# Patient Record
Sex: Male | Born: 1965 | Race: White | Hispanic: No | Smoking: Current every day smoker
Health system: Southern US, Community
[De-identification: ages and names within clinical notes are randomized; demographics above are authoritative.]

## PROBLEM LIST (undated history)

## (undated) DIAGNOSIS — K5792 Diverticulitis of intestine, part unspecified, without perforation or abscess without bleeding: Secondary | ICD-10-CM

## (undated) DIAGNOSIS — I10 Essential (primary) hypertension: Secondary | ICD-10-CM

## (undated) DIAGNOSIS — E119 Type 2 diabetes mellitus without complications: Secondary | ICD-10-CM

## (undated) DIAGNOSIS — E7800 Pure hypercholesterolemia, unspecified: Secondary | ICD-10-CM

## (undated) HISTORY — DX: Type 2 diabetes mellitus without complications: E11.9

---

## 2011-02-09 ENCOUNTER — Encounter: Payer: Self-pay | Admitting: *Deleted

## 2011-02-09 ENCOUNTER — Emergency Department (HOSPITAL_COMMUNITY): Payer: BC Managed Care – PPO

## 2011-02-09 ENCOUNTER — Other Ambulatory Visit: Payer: Self-pay

## 2011-02-09 ENCOUNTER — Emergency Department (HOSPITAL_COMMUNITY)
Admission: EM | Admit: 2011-02-09 | Discharge: 2011-02-09 | Disposition: A | Discharge status: Home / Self Care | Payer: BC Managed Care – PPO | Attending: Emergency Medicine | Admitting: Emergency Medicine

## 2011-02-09 DIAGNOSIS — R112 Nausea with vomiting, unspecified: Secondary | ICD-10-CM | POA: Insufficient documentation

## 2011-02-09 DIAGNOSIS — Z79899 Other long term (current) drug therapy: Secondary | ICD-10-CM | POA: Insufficient documentation

## 2011-02-09 DIAGNOSIS — R0602 Shortness of breath: Secondary | ICD-10-CM | POA: Insufficient documentation

## 2011-02-09 DIAGNOSIS — E7800 Pure hypercholesterolemia, unspecified: Secondary | ICD-10-CM | POA: Insufficient documentation

## 2011-02-09 DIAGNOSIS — R0789 Other chest pain: Secondary | ICD-10-CM | POA: Insufficient documentation

## 2011-02-09 DIAGNOSIS — I10 Essential (primary) hypertension: Secondary | ICD-10-CM | POA: Insufficient documentation

## 2011-02-09 HISTORY — DX: Pure hypercholesterolemia, unspecified: E78.00

## 2011-02-09 HISTORY — DX: Essential (primary) hypertension: I10

## 2011-02-09 HISTORY — DX: Diverticulitis of intestine, part unspecified, without perforation or abscess without bleeding: K57.92

## 2011-02-09 LAB — POCT I-STAT TROPONIN I: Troponin i, poc: 0.01 ng/mL (ref 0.00–0.08)

## 2011-02-09 LAB — CARDIAC PANEL(CRET KIN+CKTOT+MB+TROPI)
CK, MB: 1.9 ng/mL (ref 0.3–4.0)
CK, MB: 2 ng/mL (ref 0.3–4.0)
Relative Index: INVALID (ref 0.0–2.5)
Troponin I: <0.3 ng/mL (ref <0.30)

## 2011-02-09 LAB — DIFFERENTIAL
Basophils Absolute: 0 10*3/uL (ref 0.0–0.1)
Basophils Relative: 0 % (ref 0–1)
Lymphocytes Relative: 29 % (ref 12–46)
Monocytes Relative: 9 % (ref 3–12)
Neutro Abs: 6.3 10*3/uL (ref 1.7–7.7)
Neutrophils Relative %: 58 % (ref 43–77)

## 2011-02-09 LAB — CBC
MCH: 31.3 pg (ref 26.0–34.0)
MCHC: 36.1 g/dL — ABNORMAL HIGH (ref 30.0–36.0)
MCV: 86.5 fL (ref 78.0–100.0)
Platelets: 262 10*3/uL (ref 150–400)
RDW: 12.2 % (ref 11.5–15.5)

## 2011-02-09 LAB — BASIC METABOLIC PANEL
CO2: 24 mEq/L (ref 19–32)
Calcium: 9.7 mg/dL (ref 8.4–10.5)
Creatinine, Ser: 0.67 mg/dL (ref 0.50–1.35)
GFR calc Af Amer: >90 mL/min (ref >90)
GFR calc non Af Amer: >90 mL/min (ref >90)
Sodium: 133 mEq/L — ABNORMAL LOW (ref 135–145)

## 2011-02-09 MED ORDER — NITROGLYCERIN 2 % TD OINT
1.0000 [in_us] | TOPICAL_OINTMENT | Freq: Once | TRANSDERMAL | Status: AC
  Administered 2011-02-09: 1 [in_us] via TOPICAL
  Filled 2011-02-09: qty 30

## 2011-02-09 MED ORDER — ASPIRIN 325 MG PO TABS: 325.0000 mg | ORAL_TABLET | ORAL | Status: DC

## 2011-02-09 MED ORDER — PANTOPRAZOLE SODIUM 20 MG PO TBEC: 20.0000 mg | DELAYED_RELEASE_TABLET | Freq: Every day | ORAL | Status: DC

## 2011-02-09 MED ORDER — ASPIRIN 81 MG PO CHEW
324.0000 mg | CHEWABLE_TABLET | Freq: Once | ORAL | Status: AC
  Administered 2011-02-09: 324 mg via ORAL
  Filled 2011-02-09: qty 4

## 2011-02-09 NOTE — ED Notes (Signed)
Pt in bed, alert and oriented x 3, resp even and unlabored, mid sternal chest pain subsided to scale 5/10, pt states " i felt better, aspirin helped" will continue to monitor

## 2011-02-09 NOTE — ED Notes (Signed)
Pt placed on cardiac monitor and nasal cannula.

## 2011-02-09 NOTE — ED Notes (Signed)
Sleeping, appears comfortable, pending disposition

## 2011-02-09 NOTE — ED Provider Notes (Signed)
Medical screening examination/treatment/procedure(s) were performed by non-physician practitioner and as supervising physician I was immediately available for consultation/collaboration.  Ethelda Chick, MD 02/09/11 [phone]

## 2011-02-09 NOTE — ED Notes (Signed)
Please do not charge pt for foley cath, incorrectly charted

## 2011-02-09 NOTE — ED Provider Notes (Signed)
History     CSN: [id number]  Arrival date & time 02/09/11  0047   First MD Initiated Contact with Patient 02/09/11 0330      Chief Complaint  Patient presents with  . Chest Pain    belching  . Nausea  . Emesis    x 1 at 2100 last night  . Shortness of Breath    dyspnea @ rest    (Consider location/radiation/quality/duration/timing/severity/associated sxs/prior treatment) HPI Comments: Patient here with a 8 hour history of substernal chest pain - he states that he initially thought that he had gotten something stuck in his esophagus, reports that he felt like if he would belch that he would feel better - reports nausea and vomiting x 1 which did not help with the pain - denies diaphoresis - reports that the pain is more pressure in nature and states that he feels worse with trying to lie flat, states that he feels like he cannot breath when doing so - also reports increase in pain with deep inspiration - denies fever, chills, cough or congestion - states no personal history of CAD, recently diagnosed with hypercholesterolism but is not on any medication, takes medication for high triglycerides.  Denies diabetes   Patient is a 45 y.o. male presenting with chest pain, vomiting, and shortness of breath. The history is provided by the patient and the spouse. No language interpreter was used.  Chest Pain The chest pain began 6 - 12 hours ago. Chest pain occurs constantly. The chest pain is improving. The pain is associated with breathing (lying flat). At its most intense, the pain is at 10/10. The pain is currently at 3/10. The severity of the pain is mild. The quality of the pain is described as pressure-like. The pain does not radiate. Chest pain is worsened by deep breathing and certain positions. Primary symptoms include shortness of breath, nausea and vomiting. Pertinent negatives for primary symptoms include no fever, no fatigue, no syncope, no cough, no wheezing, no abdominal pain, no  dizziness and no altered mental status.  Pertinent negatives for associated symptoms include no claudication, no diaphoresis, no lower extremity edema, no near-syncope, no numbness, no orthopnea, no paroxysmal nocturnal dyspnea and no weakness. He tried aspirin for the symptoms. Risk factors include male gender and smoking/tobacco exposure.    Emesis  Pertinent negatives include no abdominal pain, no cough and no fever.  Shortness of Breath  Associated symptoms include chest pain and shortness of breath. Pertinent negatives include no orthopnea, no fever, no cough and no wheezing.    Past Medical History  Diagnosis Date  . Hypertension   . Hypercholesteremia   . Diverticulitis     History reviewed. No pertinent past surgical history.  History reviewed. No pertinent family history.  History  Substance Use Topics  . Smoking status: Current Everyday Smoker -- 1.0 packs/day    Types: Cigarettes  . Smokeless tobacco: Not on file  . Alcohol Use: Yes     every other day, 2-3 beers at a time      Review of Systems  Constitutional: Negative for fever, diaphoresis and fatigue.  Respiratory: Positive for shortness of breath. Negative for cough and wheezing.   Cardiovascular: Positive for chest pain. Negative for orthopnea, claudication, syncope and near-syncope.  Gastrointestinal: Positive for nausea and vomiting. Negative for abdominal pain.  Neurological: Negative for dizziness, weakness and numbness.  Psychiatric/Behavioral: Negative for altered mental status.  All other systems reviewed and are negative.    Allergies  Penicillins  Home Medications   Current Outpatient Rx  Name Route Sig Dispense Refill  . AMLODIPINE BESYLATE 10 MG PO TABS Oral Take 10 mg by mouth daily.      Marland Kitchen CIPROFLOXACIN HCL 500 MG PO TABS Oral Take 500 mg by mouth 2 (two) times daily.      Marland Kitchen LISINOPRIL-HYDROCHLOROTHIAZIDE 20-25 MG PO TABS Oral Take 1 tablet by mouth daily.      Marland Kitchen METRONIDAZOLE 500 MG  PO TABS Oral Take 500 mg by mouth 3 (three) times daily.      Marland Kitchen RANITIDINE HCL 150 MG PO CAPS Oral Take 150 mg by mouth as needed.      Marland Kitchen ROSUVASTATIN CALCIUM 5 MG PO TABS Oral Take 5 mg by mouth daily.      . IBUPROFEN 200 MG PO TABS Oral Take 200 mg by mouth every 6 (six) hours as needed.        BP 140/80  Pulse 77  Temp(Src) 98 F (36.7 C) (Oral)  Resp 13  SpO2 98%  Physical Exam  Nursing note and vitals reviewed. Constitutional: He is oriented to person, place, and time. He appears well-developed and well-nourished. No distress.  HENT:  Head: Normocephalic and atraumatic.  Right Ear: External ear normal.  Left Ear: External ear normal.  Mouth/Throat: Oropharynx is clear and moist. No oropharyngeal exudate.  Eyes: Conjunctivae are normal. Pupils are equal, round, and reactive to light. No scleral icterus.  Neck: Normal range of motion. Neck supple. No JVD present.  Cardiovascular: Normal rate, regular rhythm and normal heart sounds.  Exam reveals no gallop and no friction rub.   No murmur heard. Pulmonary/Chest: Effort normal and breath sounds normal. No respiratory distress. He exhibits no tenderness.  Abdominal: Soft. Bowel sounds are normal. He exhibits no distension. There is no tenderness.  Musculoskeletal: Normal range of motion.  Neurological: He is alert and oriented to person, place, and time. No cranial nerve deficit.  Skin: Skin is warm and dry.  Psychiatric: He has a normal mood and affect. His behavior is normal. Judgment and thought content normal.    ED Course  Procedures (including critical care time)  Labs Reviewed  CBC - Abnormal; Notable for the following:    WBC 10.6 (*)    MCHC 36.1 (*)    All other components within normal limits  BASIC METABOLIC PANEL - Abnormal; Notable for the following:    Sodium 133 (*)    Glucose, Bld 146 (*)    All other components within normal limits  POCT I-STAT TROPONIN I  I-STAT TROPONIN I  D-DIMER, QUANTITATIVE    CARDIAC PANEL(CRET KIN+CKTOT+MB+TROPI)  DIFFERENTIAL   Dg Chest 2 View  02/09/2011  *RADIOLOGY REPORT*  Clinical Data: Sudden onset of chest pain and shortness of breath.  CHEST - 2 VIEW  Comparison: None.  Findings: The lungs are well-aerated and clear.  There is no evidence of focal opacification, pleural effusion or pneumothorax.  The heart is normal in size; the mediastinal contour is within normal limits.  No acute osseous abnormalities are seen.  IMPRESSION: No acute cardiopulmonary process seen.  Original Report Authenticated By: Tonia Ghent, M.D.   Results for orders placed during the hospital encounter of 02/09/11  CBC      Component Value Range   WBC 10.6 (*) 4.0 - 10.5 (K/uL)   RBC 4.80  4.22 - 5.81 (MIL/uL)   Hemoglobin 15.0  13.0 - 17.0 (g/dL)   HCT 13.2  44.0 - 10.2 (%)  MCV 86.5  78.0 - 100.0 (fL)   MCH 31.3  26.0 - 34.0 (pg)   MCHC 36.1 (*) 30.0 - 36.0 (g/dL)   RDW 14.7  82.9 - 56.2 (%)   Platelets 262  150 - 400 (K/uL)  BASIC METABOLIC PANEL      Component Value Range   Sodium 133 (*) 135 - 145 (mEq/L)   Potassium 3.7  3.5 - 5.1 (mEq/L)   Chloride 97  96 - 112 (mEq/L)   CO2 24  19 - 32 (mEq/L)   Glucose, Bld 146 (*) 70 - 99 (mg/dL)   BUN 12  6 - 23 (mg/dL)   Creatinine, Ser 1.30  0.50 - 1.35 (mg/dL)   Calcium 9.7  8.4 - 86.5 (mg/dL)   GFR calc non Af Amer >90  >90 (mL/min)   GFR calc Af Amer >90  >90 (mL/min)  POCT I-STAT TROPONIN I      Component Value Range   Troponin i, poc 0.01  0.00 - 0.08 (ng/mL)   Comment 3           D-DIMER, QUANTITATIVE      Component Value Range   D-Dimer, Quant <0.22  0.00 - 0.48 (ug/mL-FEU)  CARDIAC PANEL(CRET KIN+CKTOT+MB+TROPI)      Component Value Range   Total CK 65  7 - 232 (U/L)   CK, MB 1.9  0.3 - 4.0 (ng/mL)   Troponin I <0.30  <0.30 (ng/mL)   Relative Index RELATIVE INDEX IS INVALID  0.0 - 2.5   DIFFERENTIAL      Component Value Range   Neutrophils Relative 58  43 - 77 (%)   Neutro Abs 6.3  1.7 - 7.7 (K/uL)    Lymphocytes Relative 29  12 - 46 (%)   Lymphs Abs 3.1  0.7 - 4.0 (K/uL)   Monocytes Relative 9  3 - 12 (%)   Monocytes Absolute 0.9  0.1 - 1.0 (K/uL)   Eosinophils Relative 4  0 - 5 (%)   Eosinophils Absolute 0.4  0.0 - 0.7 (K/uL)   Basophils Relative 0  0 - 1 (%)   Basophils Absolute 0.0  0.0 - 0.1 (K/uL)  CARDIAC PANEL(CRET KIN+CKTOT+MB+TROPI)      Component Value Range   Total CK 64  7 - 232 (U/L)   CK, MB 2.0  0.3 - 4.0 (ng/mL)   Troponin I <0.30  <0.30 (ng/mL)   Relative Index RELATIVE INDEX IS INVALID  0.0 - 2.5    Dg Chest 2 View  02/09/2011  *RADIOLOGY REPORT*  Clinical Data: Sudden onset of chest pain and shortness of breath.  CHEST - 2 VIEW  Comparison: None.  Findings: The lungs are well-aerated and clear.  There is no evidence of focal opacification, pleural effusion or pneumothorax.  The heart is normal in size; the mediastinal contour is within normal limits.  No acute osseous abnormalities are seen.  IMPRESSION: No acute cardiopulmonary process seen.  Original Report Authenticated By: Tonia Ghent, M.D.    Date: 02/09/2011  Rate: 89  Rhythm: normal sinus rhythm  QRS Axis: normal  Intervals: normal  ST/T Wave abnormalities: normal  Conduction Disutrbances:none  Narrative Interpretation: Reviewed by Dr. Karma Ganja  Old EKG Reviewed: none available    Atypical Chest pain    MDM  Patient with atypical chest pain, HEART score of 3, low risk, two sets of enzymes here are negative with no delta change - patient will follow up with PCP this coming week - knows to return  if any worsening of symptoms.  He thinks this is GERD, so I will place on protonix        Zenas Santa C. [place], [place] 02/09/11 [phone]

## 2011-02-09 NOTE — ED Notes (Signed)
Pt c/o chest pain, substernal, non-reproducible w/ palpation. Pt states feels urge to belch. Pt has vomited x 1. Pt reports pain radiating to R shoulder and R neck.

## 2012-12-03 IMAGING — CR DG CHEST 2V
2 series · 2 of 2 positions shown · non-contrast
Comparison: None.

CLINICAL DATA: Sudden onset of chest pain and shortness of breath.

CHEST - 2 VIEW

[w chest pa]
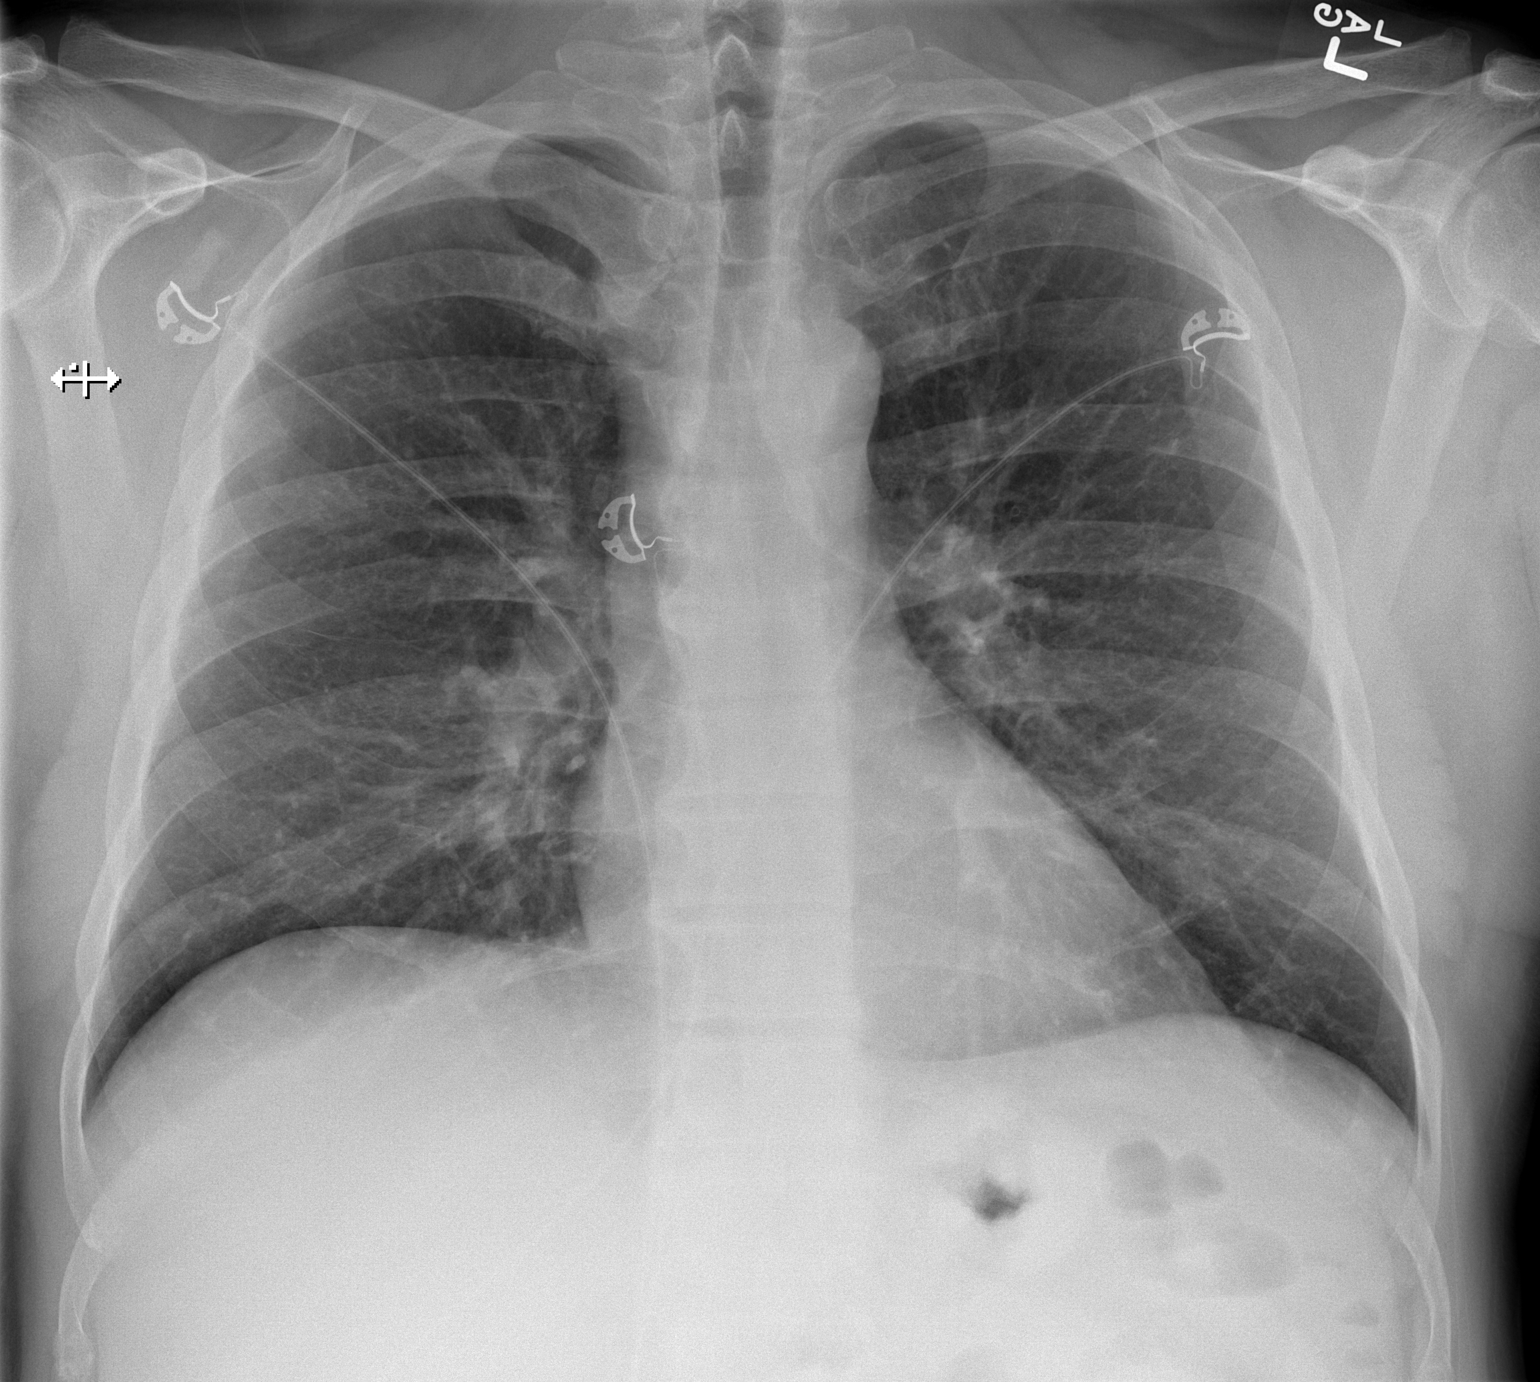

[w chest lat]
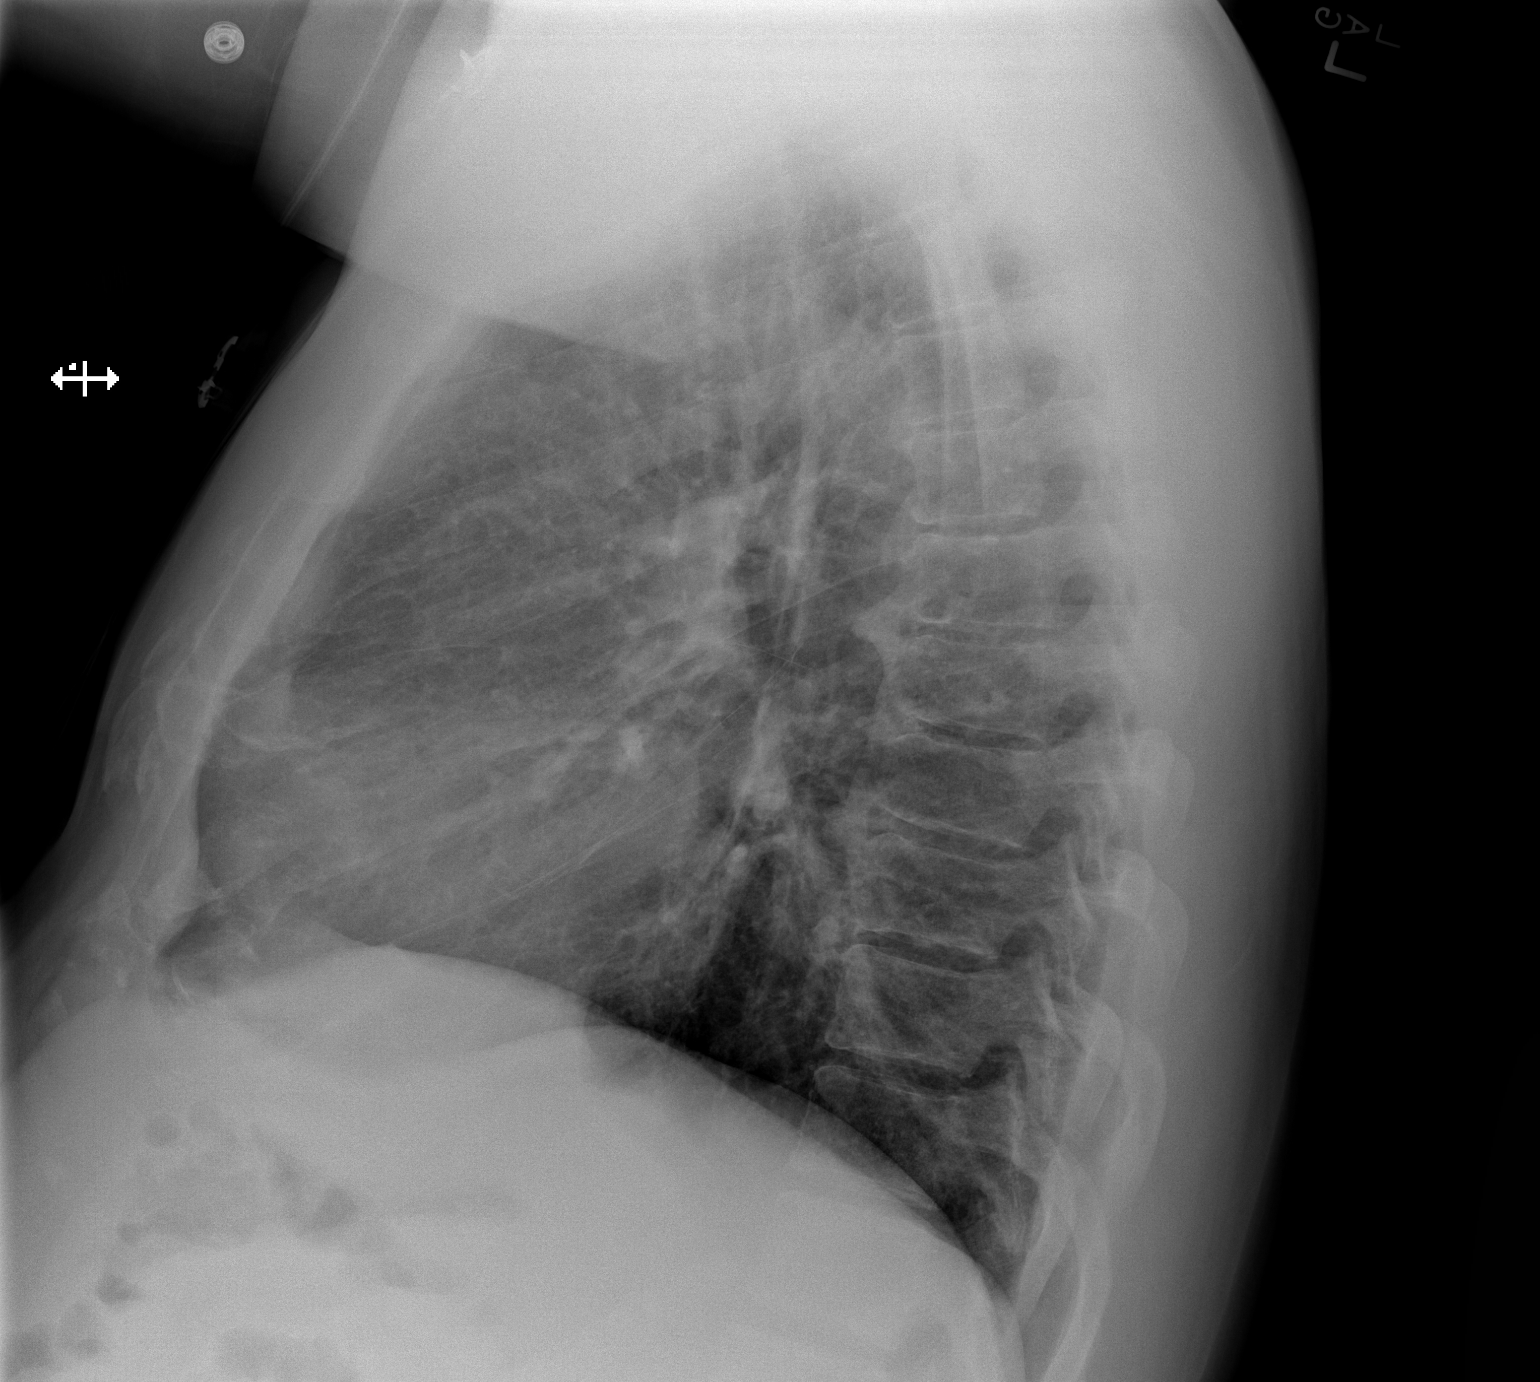

[2 of 2 positions shown; findings below may reference images not displayed]

FINDINGS: The lungs are well-aerated and clear.  There is no
evidence of focal opacification, pleural effusion or pneumothorax.

The heart is normal in size; the mediastinal contour is within
normal limits.  No acute osseous abnormalities are seen.
IMPRESSION: No acute cardiopulmonary process seen.

## 2015-10-29 DIAGNOSIS — Z86010 Personal history of colonic polyps: Secondary | ICD-10-CM | POA: Insufficient documentation

## 2016-05-08 LAB — HM COLONOSCOPY

## 2020-05-29 ENCOUNTER — Other Ambulatory Visit: Payer: Self-pay

## 2020-05-29 ENCOUNTER — Ambulatory Visit: Payer: BC Managed Care – PPO | Admitting: Family Medicine

## 2020-05-29 ENCOUNTER — Encounter: Payer: Self-pay | Admitting: Family Medicine

## 2020-05-29 VITALS — BP 115/76 | HR 81 | Temp 98.3°F | Ht 72.0 in | Wt 219.0 lb

## 2020-05-29 DIAGNOSIS — I10 Essential (primary) hypertension: Secondary | ICD-10-CM

## 2020-05-29 DIAGNOSIS — E1165 Type 2 diabetes mellitus with hyperglycemia: Secondary | ICD-10-CM | POA: Diagnosis not present

## 2020-05-29 DIAGNOSIS — Z8719 Personal history of other diseases of the digestive system: Secondary | ICD-10-CM

## 2020-05-29 DIAGNOSIS — G479 Sleep disorder, unspecified: Secondary | ICD-10-CM

## 2020-05-29 DIAGNOSIS — R208 Other disturbances of skin sensation: Secondary | ICD-10-CM

## 2020-05-29 DIAGNOSIS — K219 Gastro-esophageal reflux disease without esophagitis: Secondary | ICD-10-CM

## 2020-05-29 DIAGNOSIS — E782 Mixed hyperlipidemia: Secondary | ICD-10-CM | POA: Diagnosis not present

## 2020-05-29 MED ORDER — OMEPRAZOLE 20 MG PO CPDR: 20.0000 mg | DELAYED_RELEASE_CAPSULE | Freq: Every day | ORAL | 1 refills | Status: DC

## 2020-05-29 MED ORDER — LISINOPRIL 20 MG PO TABS: 1.0000 | ORAL_TABLET | Freq: Every day | ORAL | 1 refills | Status: DC

## 2020-05-29 MED ORDER — METFORMIN HCL 1000 MG PO TABS: 1.0000 | ORAL_TABLET | Freq: Two times a day (BID) | ORAL | 1 refills | Status: DC

## 2020-05-29 MED ORDER — HYDROCHLOROTHIAZIDE 50 MG PO TABS: 50.0000 mg | ORAL_TABLET | Freq: Every day | ORAL | 1 refills | Status: DC

## 2020-05-29 NOTE — Progress Notes (Signed)
New Patient Office Visit  Assessment & Plan:  1. Essential hypertension Well controlled on current regimen.  - hydrochlorothiazide (HYDRODIURIL) 50 MG tablet; Take 1 tablet (50 mg total) by mouth daily.  Dispense: 90 tablet; Refill: 1 - lisinopril (ZESTRIL) 20 MG tablet; Take 1 tablet (20 mg total) by mouth daily.  Dispense: 90 tablet; Refill: 1 - CMP14+EGFR; Future - Lipid panel; Future  2. Mixed hyperlipidemia Labs to assess. - Omega-3 Fatty Acids (FISH OIL) 1000 MG CAPS; Take 1,000 mg by mouth daily. - Lipid panel; Future  3. Type 2 diabetes mellitus with hyperglycemia, without long-term current use of insulin (HCC) A1c ordered to assess.  Last A1c 8.2 six months ago at which time patient reports no medication changes were made. - Medications: continue current medications until labs result - Patient is not currently taking a statin. Patient is taking an ACE-inhibitor/ARB.  - Instruction/counseling given: reminded to get eye exam, discussed diet and provided printed educational material  Diabetes Health Maintenance Due  Topic Date Due  . HEMOGLOBIN A1C  Never done  . FOOT EXAM  Never done  . OPHTHALMOLOGY EXAM  Never done    - metFORMIN (GLUCOPHAGE) 1000 MG tablet; Take 1 tablet (1,000 mg total) by mouth 2 (two) times daily.  Dispense: 180 tablet; Refill: 1 - CMP14+EGFR; Future - Lipid panel; Future - Bayer DCA Hb A1c Waived; Future  4. Gastroesophageal reflux disease, unspecified whether esophagitis present Well controlled on current regimen.  - omeprazole (PRILOSEC) 20 MG capsule; Take 1 capsule (20 mg total) by mouth daily.  Dispense: 90 capsule; Refill: 1 - CMP14+EGFR; Future  5. Burning sensation of feet Labs to assess for a cause. - Anemia Profile B; Future - CMP14+EGFR; Future - Thyroid Panel With TSH; Future - Magnesium; Future  6. History of diverticulitis Managed by gastroenterology.  7. Difficulty sleeping Discussed with patient I do not prescribe  Xanax to help with sleep.  I offered multiple other medications to help with sleep but he declined at this time.  No need to wean off the Xanax as he does not take it regularly.   Follow-up: Return in about 3 months (around 08/28/2020) for follow-up of chronic medication conditions.   Deliah Boston, MSN, APRN, FNP-C [facility]  Subjective:  Patient ID: Ricardo Duhe., male    DOB: Mar 19, 1965  Age: 55 y.o. MRN: [id number]  Patient Care Team: Gwenlyn Fudge, FNP as PCP - General (Family Medicine)  CC:  Chief Complaint  Patient presents with  . New Patient (Initial Visit)    [facility]   . Establish Care    Patient states that he has been having bilateral feet burning for months.     HPI Ricardo Luna. presents to establish care.   Hypertension: Controlled with hydrochlorothiazide and lisinopril.  Hyperlipidemia: Patient is taking fish oil 1000 mg once daily.  Diabetes: Patient presents for follow up of diabetes. Current symptoms include: hyperglycemia. Known diabetic complications: none. Medication compliance: Taking metformin 1000 mg twice daily for over a year now. Is he  on ACE inhibitor or angiotensin II receptor blocker? Yes. Is he on a statin? No.   GERD: Controlled with omeprazole.  Patient reports a burning sensation that occurs on the medial aspect of his right great toe and the lateral aspect of his left foot and pinky toe.  This occurs late in the evenings and has been going on for a few months now.  He has a foam that  treats cramps at home that is helpful when he applies it.  History of diverticulitis: Patient has as needed prescriptions for Cipro and Flagyl that come from his gastroenterologist.  Patient has a prescription for Xanax that he takes when he has trouble sleeping.  It is a 1 mg tablet but he does not take the full tablet.  His last prescription for 15 tablets was filled on 01/04/2020 and he reports he still has some  left.   Review of Systems  Constitutional: Negative for chills, fever, malaise/fatigue and weight loss.  HENT: Negative for congestion, ear discharge, ear pain, nosebleeds, sinus pain, sore throat and tinnitus.   Eyes: Negative for blurred vision, double vision, pain, discharge and redness.  Respiratory: Negative for cough, shortness of breath and wheezing.   Cardiovascular: Negative for chest pain, palpitations and leg swelling.  Gastrointestinal: Negative for abdominal pain, constipation, diarrhea, heartburn, nausea and vomiting.  Genitourinary: Negative for dysuria, frequency and urgency.  Musculoskeletal: Negative for myalgias.  Skin: Negative for rash.  Neurological: Negative for dizziness, seizures, weakness and headaches.  Psychiatric/Behavioral: Negative for depression, substance abuse and suicidal ideas. The patient is not nervous/anxious.     Current Outpatient Medications:  .  ALPRAZolam (XANAX) 1 MG tablet, Take 1 mg by mouth at bedtime as needed for anxiety., Disp: , Rfl:  .  aspirin EC 81 MG tablet, Take 81 mg by mouth daily. Swallow whole., Disp: , Rfl:  .  ciprofloxacin (CIPRO) 500 MG tablet, Take 500 mg by mouth 2 (two) times daily., Disp: , Rfl:  .  hydrochlorothiazide (HYDRODIURIL) 50 MG tablet, Take 50 mg by mouth daily., Disp: , Rfl:  .  lisinopril (ZESTRIL) 20 MG tablet, Take 1 tablet by mouth daily., Disp: , Rfl:  .  metFORMIN (GLUCOPHAGE) 1000 MG tablet, Take 1 tablet by mouth 2 (two) times daily., Disp: , Rfl:  .  metroNIDAZOLE (FLAGYL) 500 MG tablet, Take 500 mg by mouth 3 (three) times daily as needed., Disp: , Rfl:  .  Omega-3 Fatty Acids (FISH OIL) 1000 MG CAPS, Take by mouth., Disp: , Rfl:  .  omeprazole (PRILOSEC) 20 MG capsule, Take 20 mg by mouth daily., Disp: , Rfl:   Allergies  Allergen Reactions  . Penicillins     Past Medical History:  Diagnosis Date  . Diverticulitis   . Hypercholesteremia   . Hypertension     History reviewed. No  pertinent surgical history.  Family History  Problem Relation Age of Onset  . Hypertension Mother   . Diabetes Mother   . Stomach cancer Mother   . Diabetes Father   . Thyroid cancer Daughter     Social History   Socioeconomic History  . Marital status: Single    Spouse name: Not on file  . Number of children: Not on file  . Years of education: Not on file  . Highest education level: Not on file  Occupational History  . Not on file  Tobacco Use  . Smoking status: Current Every Day Smoker    Packs/day: 1.00    Types: Cigarettes  . Smokeless tobacco: Never Used  Vaping Use  . Vaping Use: Never used  Substance and Sexual Activity  . Alcohol use: Yes    Comment: every other day, 2-3 beers at a time  . Drug use: No  . Sexual activity: Yes    Birth control/protection: None  Other Topics Concern  . Not on file  Social History Narrative  . Not on file  Social Determinants of Health   Financial Resource Strain: Not on file  Food Insecurity: Not on file  Transportation Needs: Not on file  Physical Activity: Not on file  Stress: Not on file  Social Connections: Not on file  Intimate Partner Violence: Not on file    Objective:   Today's Vitals: BP 115/76   Pulse 81   Temp 98.3 F (36.8 C) (Temporal)   Ht 6' (1.829 m)   Wt 219 lb (99.3 kg)   SpO2 95%   BMI 29.70 kg/m   Physical Exam Vitals reviewed.  Constitutional:      General: He is not in acute distress.    Appearance: Normal appearance. He is overweight. He is not ill-appearing, toxic-appearing or diaphoretic.  HENT:     Head: Normocephalic and atraumatic.  Eyes:     General: No scleral icterus.       Right eye: No discharge.        Left eye: No discharge.     Conjunctiva/sclera: Conjunctivae normal.  Cardiovascular:     Rate and Rhythm: Normal rate and regular rhythm.     Heart sounds: Normal heart sounds. No murmur heard. No friction rub. No gallop.   Pulmonary:     Effort: Pulmonary effort is  normal. No respiratory distress.     Breath sounds: Normal breath sounds. No stridor. No wheezing, rhonchi or rales.  Musculoskeletal:        General: Normal range of motion.     Cervical back: Normal range of motion.  Skin:    General: Skin is warm and dry.  Neurological:     Mental Status: He is alert and oriented to person, place, and time. Mental status is at baseline.  Psychiatric:        Mood and Affect: Mood normal.        Behavior: Behavior normal.        Thought Content: Thought content normal.        Judgment: Judgment normal.

## 2020-05-29 NOTE — Patient Instructions (Signed)
Diabetes Mellitus and Nutrition, Adult When you have diabetes, or diabetes mellitus, it is very important to have healthy eating habits because your blood sugar (glucose) levels are greatly affected by what you eat and drink. Eating healthy foods in the right amounts, at about the same times every day, can help you:  Control your blood glucose.  Lower your risk of heart disease.  Improve your blood pressure.  Reach or maintain a healthy weight. What can affect my meal plan? Every person with diabetes is different, and each person has different needs for a meal plan. Your health care provider may recommend that you work with a dietitian to make a meal plan that is best for you. Your meal plan may vary depending on factors such as:  The calories you need.  The medicines you take.  Your weight.  Your blood glucose, blood pressure, and cholesterol levels.  Your activity level.  Other health conditions you have, such as heart or kidney disease. How do carbohydrates affect me? Carbohydrates, also called carbs, affect your blood glucose level more than any other type of food. Eating carbs naturally raises the amount of glucose in your blood. Carb counting is a method for keeping track of how many carbs you eat. Counting carbs is important to keep your blood glucose at a healthy level, especially if you use insulin or take certain oral diabetes medicines. It is important to know how many carbs you can safely have in each meal. This is different for every person. Your dietitian can help you calculate how many carbs you should have at each meal and for each snack. How does alcohol affect me? Alcohol can cause a sudden decrease in blood glucose (hypoglycemia), especially if you use insulin or take certain oral diabetes medicines. Hypoglycemia can be a life-threatening condition. Symptoms of hypoglycemia, such as sleepiness, dizziness, and confusion, are similar to symptoms of having too much  alcohol.  Do not drink alcohol if: ? Your health care provider tells you not to drink. ? You are pregnant, may be pregnant, or are planning to become pregnant.  If you drink alcohol: ? Do not drink on an empty stomach. ? Limit how much you use to:  0-1 drink a day for women.  0-2 drinks a day for men. ? Be aware of how much alcohol is in your drink. In the [place] one drink equals one 12 oz bottle of beer (355 mL), one 5 oz glass of wine (148 mL), or one 1 oz glass of hard liquor (44 mL). ? Keep yourself hydrated with water, diet soda, or unsweetened iced tea.  Keep in mind that regular soda, juice, and other mixers may contain a lot of sugar and must be counted as carbs. What are tips for following this plan? Reading food labels  Start by checking the serving size on the "Nutrition Facts" label of packaged foods and drinks. The amount of calories, carbs, fats, and other nutrients listed on the label is based on one serving of the item. Many items contain more than one serving per package.  Check the total grams (g) of carbs in one serving. You can calculate the number of servings of carbs in one serving by dividing the total carbs by 15. For example, if a food has 30 g of total carbs per serving, it would be equal to 2 servings of carbs.  Check the number of grams (g) of saturated fats and trans fats in one serving. Choose foods that have   a low amount or none of these fats.  Check the number of milligrams (mg) of salt (sodium) in one serving. Most people should limit total sodium intake to less than 2,300 mg per day.  Always check the nutrition information of foods labeled as "low-fat" or "nonfat." These foods may be higher in added sugar or refined carbs and should be avoided.  Talk to your dietitian to identify your daily goals for nutrients listed on the label. Shopping  Avoid buying canned, pre-made, or processed foods. These foods tend to be high in fat, sodium, and added  sugar.  Shop around the outside edge of the grocery store. This is where you will most often find fresh fruits and vegetables, bulk grains, fresh meats, and fresh dairy. Cooking  Use low-heat cooking methods, such as baking, instead of high-heat cooking methods like deep frying.  Cook using healthy oils, such as olive, canola, or sunflower oil.  Avoid cooking with butter, cream, or high-fat meats. Meal planning  Eat meals and snacks regularly, preferably at the same times every day. Avoid going long periods of time without eating.  Eat foods that are high in fiber, such as fresh fruits, vegetables, beans, and whole grains. Talk with your dietitian about how many servings of carbs you can eat at each meal.  Eat 4-6 oz (112-168 g) of lean protein each day, such as lean meat, chicken, fish, eggs, or tofu. One ounce (oz) of lean protein is equal to: ? 1 oz (28 g) of meat, chicken, or fish. ? 1 egg. ?  cup (62 g) of tofu.  Eat some foods each day that contain healthy fats, such as avocado, nuts, seeds, and fish.   What foods should I eat? Fruits Berries. Apples. Oranges. Peaches. Apricots. Plums. Grapes. Mango. Papaya. Pomegranate. Kiwi. Cherries. Vegetables Lettuce. Spinach. Leafy greens, including kale, chard, collard greens, and mustard greens. Beets. Cauliflower. Cabbage. Broccoli. Carrots. Green beans. Tomatoes. Peppers. Onions. Cucumbers. Brussels sprouts. Grains Whole grains, such as whole-wheat or whole-grain bread, crackers, tortillas, cereal, and pasta. Unsweetened oatmeal. Quinoa. Brown or wild rice. Meats and other proteins Seafood. Poultry without skin. Lean cuts of poultry and beef. Tofu. Nuts. Seeds. Dairy Low-fat or fat-free dairy products such as milk, yogurt, and cheese. The items listed above may not be a complete list of foods and beverages you can eat. Contact a dietitian for more information. What foods should I avoid? Fruits Fruits canned with  syrup. Vegetables Canned vegetables. Frozen vegetables with butter or cream sauce. Grains Refined white flour and flour products such as bread, pasta, snack foods, and cereals. Avoid all processed foods. Meats and other proteins Fatty cuts of meat. Poultry with skin. Breaded or fried meats. Processed meat. Avoid saturated fats. Dairy Full-fat yogurt, cheese, or milk. Beverages Sweetened drinks, such as soda or iced tea. The items listed above may not be a complete list of foods and beverages you should avoid. Contact a dietitian for more information. Questions to ask a health care provider  Do I need to meet with a diabetes educator?  Do I need to meet with a dietitian?  What number can I call if I have questions?  When are the best times to check my blood glucose? Where to find more information:  American Diabetes Association: diabetes.org  Academy of Nutrition and Dietetics: www.eatright.org  National Institute of Diabetes and Digestive and Kidney Diseases: www.niddk.nih.gov  Association of Diabetes Care and Education Specialists: www.diabeteseducator.org Summary  It is important to have healthy eating   habits because your blood sugar (glucose) levels are greatly affected by what you eat and drink.  A healthy meal plan will help you control your blood glucose and maintain a healthy lifestyle.  Your health care provider may recommend that you work with a dietitian to make a meal plan that is best for you.  Keep in mind that carbohydrates (carbs) and alcohol have immediate effects on your blood glucose levels. It is important to count carbs and to use alcohol carefully. This information is not intended to replace advice given to you by your health care provider. Make sure you discuss any questions you have with your health care provider. Document Revised: 01/10/2019 Document Reviewed: 01/10/2019 Elsevier Patient Education  2021 Elsevier Inc.  

## 2020-06-03 ENCOUNTER — Encounter: Payer: Self-pay | Admitting: Family Medicine

## 2020-06-03 DIAGNOSIS — R208 Other disturbances of skin sensation: Secondary | ICD-10-CM | POA: Insufficient documentation

## 2020-06-03 DIAGNOSIS — I10 Essential (primary) hypertension: Secondary | ICD-10-CM | POA: Insufficient documentation

## 2020-06-03 DIAGNOSIS — Z8719 Personal history of other diseases of the digestive system: Secondary | ICD-10-CM | POA: Insufficient documentation

## 2020-06-03 DIAGNOSIS — G479 Sleep disorder, unspecified: Secondary | ICD-10-CM | POA: Insufficient documentation

## 2020-06-03 DIAGNOSIS — E1121 Type 2 diabetes mellitus with diabetic nephropathy: Secondary | ICD-10-CM | POA: Insufficient documentation

## 2020-06-03 DIAGNOSIS — E782 Mixed hyperlipidemia: Secondary | ICD-10-CM | POA: Insufficient documentation

## 2020-06-03 DIAGNOSIS — K219 Gastro-esophageal reflux disease without esophagitis: Secondary | ICD-10-CM | POA: Insufficient documentation

## 2020-06-03 DIAGNOSIS — E1165 Type 2 diabetes mellitus with hyperglycemia: Secondary | ICD-10-CM | POA: Insufficient documentation

## 2020-06-28 ENCOUNTER — Other Ambulatory Visit: Payer: Self-pay

## 2020-06-28 ENCOUNTER — Other Ambulatory Visit: Payer: BC Managed Care – PPO

## 2020-06-28 DIAGNOSIS — I10 Essential (primary) hypertension: Secondary | ICD-10-CM

## 2020-06-28 DIAGNOSIS — E1165 Type 2 diabetes mellitus with hyperglycemia: Secondary | ICD-10-CM

## 2020-06-28 DIAGNOSIS — K219 Gastro-esophageal reflux disease without esophagitis: Secondary | ICD-10-CM

## 2020-06-28 DIAGNOSIS — R208 Other disturbances of skin sensation: Secondary | ICD-10-CM

## 2020-06-28 DIAGNOSIS — E782 Mixed hyperlipidemia: Secondary | ICD-10-CM

## 2020-06-28 LAB — BAYER DCA HB A1C WAIVED: HB A1C (BAYER DCA - WAIVED): 8.4 % — ABNORMAL HIGH (ref <7.0)

## 2020-06-29 LAB — LIPID PANEL
Chol/HDL Ratio: 6.4 ratio — ABNORMAL HIGH (ref 0.0–5.0)
Cholesterol, Total: 172 mg/dL (ref 100–199)
HDL: 27 mg/dL — ABNORMAL LOW (ref >39)
LDL Chol Calc (NIH): 75 mg/dL (ref 0–99)
Triglycerides: 441 mg/dL — ABNORMAL HIGH (ref 0–149)
VLDL Cholesterol Cal: 70 mg/dL — ABNORMAL HIGH (ref 5–40)

## 2020-06-29 LAB — THYROID PANEL WITH TSH
Free Thyroxine Index: 2.1 (ref 1.2–4.9)
T3 Uptake Ratio: 31 % (ref 24–39)
T4, Total: 6.8 ug/dL (ref 4.5–12.0)
TSH: 2.04 u[IU]/mL (ref 0.450–4.500)

## 2020-06-29 LAB — CMP14+EGFR
ALT: 32 IU/L (ref 0–44)
AST: 20 IU/L (ref 0–40)
Albumin/Globulin Ratio: 2.1 (ref 1.2–2.2)
Albumin: 4.7 g/dL (ref 3.8–4.9)
Alkaline Phosphatase: 53 IU/L (ref 44–121)
BUN/Creatinine Ratio: 14 (ref 9–20)
BUN: 9 mg/dL (ref 6–24)
Bilirubin Total: 1 mg/dL (ref 0.0–1.2)
CO2: 22 mmol/L (ref 20–29)
Calcium: 9.4 mg/dL (ref 8.7–10.2)
Chloride: 93 mmol/L — ABNORMAL LOW (ref 96–106)
Creatinine, Ser: 0.64 mg/dL — ABNORMAL LOW (ref 0.76–1.27)
Globulin, Total: 2.2 g/dL (ref 1.5–4.5)
Glucose: 218 mg/dL — ABNORMAL HIGH (ref 65–99)
Potassium: 4 mmol/L (ref 3.5–5.2)
Sodium: 134 mmol/L (ref 134–144)
Total Protein: 6.9 g/dL (ref 6.0–8.5)
eGFR: 111 mL/min/{1.73_m2} (ref >59)

## 2020-06-29 LAB — ANEMIA PROFILE B
Basophils Absolute: 0.1 10*3/uL (ref 0.0–0.2)
Basos: 1 %
EOS (ABSOLUTE): 0.3 10*3/uL (ref 0.0–0.4)
Eos: 4 %
Ferritin: 296 ng/mL (ref 30–400)
Folate: 12.6 ng/mL (ref >3.0)
Hematocrit: 45.7 % (ref 37.5–51.0)
Hemoglobin: 16.1 g/dL (ref 13.0–17.7)
Immature Grans (Abs): 0 10*3/uL (ref 0.0–0.1)
Immature Granulocytes: 0 %
Iron Saturation: 46 % (ref 15–55)
Iron: 161 ug/dL (ref 38–169)
Lymphocytes Absolute: 3.2 10*3/uL — ABNORMAL HIGH (ref 0.7–3.1)
Lymphs: 43 %
MCH: 32.5 pg (ref 26.6–33.0)
MCHC: 35.2 g/dL (ref 31.5–35.7)
MCV: 92 fL (ref 79–97)
Monocytes Absolute: 0.6 10*3/uL (ref 0.1–0.9)
Monocytes: 8 %
Neutrophils Absolute: 3.3 10*3/uL (ref 1.4–7.0)
Neutrophils: 44 %
Platelets: 244 10*3/uL (ref 150–450)
RBC: 4.96 x10E6/uL (ref 4.14–5.80)
RDW: 12.6 % (ref 11.6–15.4)
Retic Ct Pct: 1.7 % (ref 0.6–2.6)
Total Iron Binding Capacity: 347 ug/dL (ref 250–450)
UIBC: 186 ug/dL (ref 111–343)
Vitamin B-12: 609 pg/mL (ref 232–1245)
WBC: 7.5 10*3/uL (ref 3.4–10.8)

## 2020-06-29 LAB — MAGNESIUM: Magnesium: 1.8 mg/dL (ref 1.6–2.3)

## 2020-06-30 ENCOUNTER — Other Ambulatory Visit: Payer: Self-pay | Admitting: Family Medicine

## 2020-06-30 DIAGNOSIS — E1165 Type 2 diabetes mellitus with hyperglycemia: Secondary | ICD-10-CM

## 2020-06-30 MED ORDER — SITAGLIPTIN PHOSPHATE 50 MG PO TABS: 50.0000 mg | ORAL_TABLET | Freq: Every day | ORAL | 2 refills | Status: DC

## 2020-07-02 MED ORDER — ATORVASTATIN CALCIUM 10 MG PO TABS: 10.0000 mg | ORAL_TABLET | Freq: Every day | ORAL | 2 refills | Status: DC

## 2020-08-15 ENCOUNTER — Ambulatory Visit: Payer: BC Managed Care – PPO | Admitting: Family Medicine

## 2020-08-15 ENCOUNTER — Encounter: Payer: Self-pay | Admitting: Family Medicine

## 2020-08-15 ENCOUNTER — Other Ambulatory Visit: Payer: Self-pay

## 2020-08-15 VITALS — BP 117/80 | HR 105 | Ht 72.0 in | Wt 215.0 lb

## 2020-08-15 DIAGNOSIS — L600 Ingrowing nail: Secondary | ICD-10-CM

## 2020-08-15 DIAGNOSIS — B353 Tinea pedis: Secondary | ICD-10-CM | POA: Diagnosis not present

## 2020-08-15 MED ORDER — CEPHALEXIN 500 MG PO CAPS: 500.0000 mg | ORAL_CAPSULE | Freq: Four times a day (QID) | ORAL | 0 refills | Status: DC

## 2020-08-15 NOTE — Progress Notes (Signed)
BP 117/80   Pulse (!) 105   Ht 6' (1.829 m)   Wt 215 lb (97.5 kg)   SpO2 95%   BMI 29.16 kg/m    Subjective:   Patient ID: Ricardo Conn., male    DOB: [birth year], 55 y.o.   MRN: [id number]  HPI: Ricardo Diltz. is a 55 y.o. male presenting on 08/15/2020 for lesion (Right foot. Between 2nd 3rd toe. No pain, place is red)   HPI Patient has a spot between his toes that is blistered and opened and he was concerned about it because he has diabetes.  Is between his second and third toe on his right foot.  He says it is sore to touch but does not have pain just distal to being there.  He also has some pain on the medial aspect of his right great toe that is been red and he has been doing soaks before this spot popped up.  He just noticed this 1 spot between toes today but his toenail has been causing problems for just under a week.  He denies any fevers or chills or redness or warmth.  Relevant past medical, surgical, family and social history reviewed and updated as indicated. Interim medical history since our last visit reviewed. Allergies and medications reviewed and updated.  Review of Systems  Constitutional:  Negative for chills and fever.  Respiratory:  Negative for shortness of breath and wheezing.   Cardiovascular:  Negative for chest pain and leg swelling.  Musculoskeletal:  Negative for back pain and gait problem.  Skin:  Positive for color change, rash and wound.  All other systems reviewed and are negative.  Per HPI unless specifically indicated above   Allergies as of 08/15/2020       Reactions   Penicillins         Medication List        Accurate as of August 15, 2020  2:08 PM. If you have any questions, ask your nurse or doctor.          STOP taking these medications    ciprofloxacin 500 MG tablet Commonly known as: CIPRO Stopped by: Fransisca Kaufmann Riann Oman, MD   metroNIDAZOLE 500 MG tablet Commonly known as: FLAGYL Stopped by: Fransisca Kaufmann Sasan Wilkie,  MD       TAKE these medications    aspirin EC 81 MG tablet Take 81 mg by mouth daily. Swallow whole.   atorvastatin 10 MG tablet Commonly known as: LIPITOR Take 1 tablet (10 mg total) by mouth daily.   cephALEXin 500 MG capsule Commonly known as: KEFLEX Take 1 capsule (500 mg total) by mouth 4 (four) times daily. Started by: Fransisca Kaufmann Maniya Donovan, MD   Fish Oil 1000 MG Caps Take 1,000 mg by mouth daily.   hydrochlorothiazide 50 MG tablet Commonly known as: HYDRODIURIL Take 1 tablet (50 mg total) by mouth daily.   lisinopril 20 MG tablet Commonly known as: ZESTRIL Take 1 tablet (20 mg total) by mouth daily.   metFORMIN 1000 MG tablet Commonly known as: GLUCOPHAGE Take 1 tablet (1,000 mg total) by mouth 2 (two) times daily.   omeprazole 20 MG capsule Commonly known as: PRILOSEC Take 1 capsule (20 mg total) by mouth daily.   sitaGLIPtin 50 MG tablet Commonly known as: Januvia Take 1 tablet (50 mg total) by mouth daily.         Objective:   BP 117/80   Pulse (!) 105   Ht 6' (1.829  m)   Wt 215 lb (97.5 kg)   SpO2 95%   BMI 29.16 kg/m   Wt Readings from Last 3 Encounters:  08/15/20 215 lb (97.5 kg)  05/29/20 219 lb (99.3 kg)    Physical Exam Vitals and nursing note reviewed.  Constitutional:      General: He is not in acute distress.    Appearance: He is well-developed. He is not diaphoretic.  Eyes:     General: No scleral icterus.    Conjunctiva/sclera: Conjunctivae normal.  Neck:     Thyroid: No thyromegaly.  Musculoskeletal:     Cervical back: Neck supple.  Lymphadenopathy:     Cervical: No cervical adenopathy.  Skin:    General: Skin is warm and dry.     Findings: Erythema (Erythema on the medial aspect of right great toe next to the toenail, thickened and slightly tender to palpation.) and lesion (Open blister between second and third toes of right foot, no signs of erythema or drainage or purulence.) present. No rash.  Neurological:      Mental Status: He is alert and oriented to person, place, and time.     Coordination: Coordination normal.  Psychiatric:        Behavior: Behavior normal.      Assessment & Plan:   Problem List Items Addressed This Visit   None Visit Diagnoses     Athlete's foot on right    -  Primary   Relevant Medications   cephALEXin (KEFLEX) 500 MG capsule   Ingrown toenail of right foot with infection       Relevant Medications   cephALEXin (KEFLEX) 500 MG capsule       Sent Keflex for ingrown toenail, recommended athlete's foot spray and powder twice a day on his right foot including the area where the sores between his toes. Return if sores not healing in 1 to 2 weeks Follow up plan: Return if symptoms worsen or fail to improve.  Counseling provided for all of the vaccine components No orders of the defined types were placed in this encounter.   Caryl Pina, MD [facility] 08/15/2020, 2:08 PM

## 2020-08-28 ENCOUNTER — Other Ambulatory Visit: Payer: Self-pay

## 2020-08-28 ENCOUNTER — Ambulatory Visit: Payer: BC Managed Care – PPO | Admitting: Family Medicine

## 2020-08-28 ENCOUNTER — Encounter: Payer: Self-pay | Admitting: Family Medicine

## 2020-08-28 VITALS — BP 116/75 | HR 89 | Temp 97.9°F | Ht 73.0 in | Wt 215.8 lb

## 2020-08-28 DIAGNOSIS — I10 Essential (primary) hypertension: Secondary | ICD-10-CM | POA: Diagnosis not present

## 2020-08-28 DIAGNOSIS — E1165 Type 2 diabetes mellitus with hyperglycemia: Secondary | ICD-10-CM | POA: Diagnosis not present

## 2020-08-28 DIAGNOSIS — E782 Mixed hyperlipidemia: Secondary | ICD-10-CM | POA: Diagnosis not present

## 2020-08-28 DIAGNOSIS — H02825 Cysts of left lower eyelid: Secondary | ICD-10-CM

## 2020-08-28 DIAGNOSIS — R208 Other disturbances of skin sensation: Secondary | ICD-10-CM | POA: Diagnosis not present

## 2020-08-28 MED ORDER — GABAPENTIN 100 MG PO CAPS: 100.0000 mg | ORAL_CAPSULE | Freq: Every day | ORAL | 2 refills | Status: DC | PRN

## 2020-08-28 MED ORDER — SITAGLIPTIN PHOSPHATE 50 MG PO TABS: 50.0000 mg | ORAL_TABLET | Freq: Every day | ORAL | 1 refills | Status: DC

## 2020-08-28 MED ORDER — BLOOD GLUCOSE MONITOR KIT: PACK | 0 refills | Status: DC

## 2020-08-28 MED ORDER — ATORVASTATIN CALCIUM 10 MG PO TABS: 10.0000 mg | ORAL_TABLET | Freq: Every day | ORAL | 1 refills | Status: DC

## 2020-08-28 NOTE — Patient Instructions (Addendum)
Call and get put on a wait list for Dr. Denna Haggard.  Return on/after August 15th for lab work.

## 2020-08-29 ENCOUNTER — Encounter: Payer: Self-pay | Admitting: Family Medicine

## 2020-08-29 NOTE — Progress Notes (Signed)
Assessment & Plan:  1. Type 2 diabetes mellitus with hyperglycemia, without long-term current use of insulin (HCC) Lab Results  Component Value Date   HGBA1C 8.4 (H) 06/28/2020  A1c was 8.4 on 06/28/2020 at which time Januvia was added. A1c not repeated today since it has not been 3 months yet.  - Medications: continue current medications - Home glucose monitoring: glucometer prescribed today - Patient is currently taking a statin. Patient is taking an ACE-inhibitor/ARB.  - Instruction/counseling given: reminded to get eye exam and discussed foot care  Diabetes Health Maintenance Due  Topic Date Due   OPHTHALMOLOGY EXAM  Never done   HEMOGLOBIN A1C  12/29/2020   FOOT EXAM  08/28/2021    - sitaGLIPtin (JANUVIA) 50 MG tablet; Take 1 tablet (50 mg total) by mouth daily.  Dispense: 90 tablet; Refill: 1 - atorvastatin (LIPITOR) 10 MG tablet; Take 1 tablet (10 mg total) by mouth daily.  Dispense: 90 tablet; Refill: 1 - CMP14+EGFR; Future - Lipid panel; Future - Bayer DCA Hb A1c Waived; Future - blood glucose meter kit and supplies KIT; Dispense based on patient and insurance preference. Use up to four times daily as directed.  Dispense: 1 each; Refill: 0  2. Burning sensation of feet Started gabapentin with directions to start with 100 mg once daily. Advised he may either increase to 100 mg TID or 300 mg QD. - gabapentin (NEURONTIN) 100 MG capsule; Take 1-3 capsules (100-300 mg total) by mouth daily as needed.  Dispense: 30 capsule; Refill: 2  3. Essential hypertension Well controlled on current regimen.  - CMP14+EGFR; Future - Lipid panel; Future  4. Mixed hyperlipidemia Atorvastatin was started after his last lab work. Will repeat in ~1 month  when it has been 3 months since starting. - atorvastatin (LIPITOR) 10 MG tablet; Take 1 tablet (10 mg total) by mouth daily.  Dispense: 90 tablet; Refill: 1 - CMP14+EGFR; Future - Lipid panel; Future  5. Cyst of left lower eyelid Keep  appointment with dermatology. Call to be added to the wait list for appointment cancellations.   Patient to return for lab work on/after 09/30/2020 so that it has been 3 months since his last labs were drawn.    Follow-up: Return as directed after labs result.   Deliah Boston, MSN, APRN, FNP-C [facility]  Subjective:  Patient ID: Ricardo Luna., male    DOB: 09-Dec-1965  Age: 55 y.o. MRN: [id number]  Patient Care Team: Gwenlyn Fudge, FNP as PCP - General (Family Medicine)  CC:  Chief Complaint  Patient presents with   Diabetes   Hypertension    3 month follow up of chronic medical conditions    burning in feet    Patient states that he has burning on the side of his right foot for a few months on and off     HPI Ricardo Luna. presents to follow-up on his chronic medical conditions.   Hypertension: Controlled with hydrochlorothiazide and lisinopril.  Hyperlipidemia: Patient is taking fish oil 1000 mg once daily.  Diabetes: Patient presents for follow up of diabetes. Current symptoms include: hyperglycemia. Known diabetic complications: peripheral neuropathy. Medication compliance: Yes. Is he  on ACE inhibitor or angiotensin II receptor blocker? Yes. Is he on a statin? Yes.   Patient has a cyst under his left eyelid and has an appointment coming up with his dermatologist for removal.    Review of Systems  Constitutional:  Negative for chills, fever, malaise/fatigue  and weight loss.  HENT:  Negative for congestion, ear discharge, ear pain, nosebleeds, sinus pain, sore throat and tinnitus.   Eyes:  Negative for blurred vision, double vision, pain, discharge and redness.  Respiratory:  Negative for cough, shortness of breath and wheezing.   Cardiovascular:  Negative for chest pain, palpitations and leg swelling.  Gastrointestinal:  Negative for abdominal pain, constipation, diarrhea, heartburn, nausea and vomiting.  Genitourinary:  Negative for  dysuria, frequency and urgency.  Musculoskeletal:  Negative for myalgias.  Skin:  Negative for rash.  Neurological:  Negative for dizziness, seizures, weakness and headaches.  Psychiatric/Behavioral:  Negative for depression, substance abuse and suicidal ideas. The patient is not nervous/anxious.     Current Outpatient Medications:    aspirin EC 81 MG tablet, Take 81 mg by mouth daily. Swallow whole., Disp: , Rfl:    blood glucose meter kit and supplies KIT, Dispense based on patient and insurance preference. Use up to four times daily as directed., Disp: 1 each, Rfl: 0   gabapentin (NEURONTIN) 100 MG capsule, Take 1-3 capsules (100-300 mg total) by mouth daily as needed., Disp: 30 capsule, Rfl: 2   hydrochlorothiazide (HYDRODIURIL) 50 MG tablet, Take 1 tablet (50 mg total) by mouth daily., Disp: 90 tablet, Rfl: 1   lisinopril (ZESTRIL) 20 MG tablet, Take 1 tablet (20 mg total) by mouth daily., Disp: 90 tablet, Rfl: 1   metFORMIN (GLUCOPHAGE) 1000 MG tablet, Take 1 tablet (1,000 mg total) by mouth 2 (two) times daily., Disp: 180 tablet, Rfl: 1   Omega-3 Fatty Acids (FISH OIL) 1000 MG CAPS, Take 1,000 mg by mouth daily., Disp: , Rfl:    omeprazole (PRILOSEC) 20 MG capsule, Take 1 capsule (20 mg total) by mouth daily., Disp: 90 capsule, Rfl: 1   atorvastatin (LIPITOR) 10 MG tablet, Take 1 tablet (10 mg total) by mouth daily., Disp: 90 tablet, Rfl: 1   sitaGLIPtin (JANUVIA) 50 MG tablet, Take 1 tablet (50 mg total) by mouth daily., Disp: 90 tablet, Rfl: 1  Allergies  Allergen Reactions   Penicillins     Past Medical History:  Diagnosis Date   Diverticulitis    Hypercholesteremia    Hypertension    Type 2 diabetes mellitus ([place])     History reviewed. No pertinent surgical history.  Family History  Problem Relation Age of Onset   Hypertension Mother    Diabetes Mother    Stomach cancer Mother    Diabetes Father    Hypertension Father    Thyroid cancer Daughter     Social  History   Socioeconomic History   Marital status: Single    Spouse name: Not on file   Number of children: Not on file   Years of education: Not on file   Highest education level: Not on file  Occupational History   Not on file  Tobacco Use   Smoking status: Every Day    Packs/day: 1.00    Types: Cigarettes   Smokeless tobacco: Never  Vaping Use   Vaping Use: Never used  Substance and Sexual Activity   Alcohol use: Yes    Comment: every other day, 2-3 beers at a time   Drug use: No   Sexual activity: Yes    Birth control/protection: None  Other Topics Concern   Not on file  Social History Narrative   Not on file   Social Determinants of Health   Financial Resource Strain: Not on file  Food Insecurity: Not on file  Transportation  Needs: Not on file  Physical Activity: Not on file  Stress: Not on file  Social Connections: Not on file  Intimate Partner Violence: Not on file    Objective:   Today's Vitals: BP 116/75   Pulse 89   Temp 97.9 F (36.6 C)   Ht $R'6\' 1"'ra$  (1.854 m)   Wt 215 lb 12.8 oz (97.9 kg)   SpO2 96%   BMI 28.47 kg/m   Physical Exam Vitals reviewed.  Constitutional:      General: He is not in acute distress.    Appearance: Normal appearance. He is overweight. He is not ill-appearing, toxic-appearing or diaphoretic.  HENT:     Head: Normocephalic and atraumatic.  Eyes:     General: No scleral icterus.       Right eye: No discharge.        Left eye: No discharge.     Conjunctiva/sclera: Conjunctivae normal.     Comments: Cyst of left lower eyelid on lateral side.  Cardiovascular:     Rate and Rhythm: Normal rate and regular rhythm.     Heart sounds: Normal heart sounds. No murmur heard.   No friction rub. No gallop.  Pulmonary:     Effort: Pulmonary effort is normal. No respiratory distress.     Breath sounds: Normal breath sounds. No stridor. No wheezing, rhonchi or rales.  Musculoskeletal:        General: Normal range of motion.      Cervical back: Normal range of motion.  Skin:    General: Skin is warm and dry.  Neurological:     Mental Status: He is alert and oriented to person, place, and time. Mental status is at baseline.  Psychiatric:        Mood and Affect: Mood normal.        Behavior: Behavior normal.        Thought Content: Thought content normal.        Judgment: Judgment normal.

## 2020-10-11 ENCOUNTER — Other Ambulatory Visit: Payer: Self-pay

## 2020-10-11 ENCOUNTER — Other Ambulatory Visit: Payer: BC Managed Care – PPO

## 2020-10-11 DIAGNOSIS — E1165 Type 2 diabetes mellitus with hyperglycemia: Secondary | ICD-10-CM

## 2020-10-11 DIAGNOSIS — I10 Essential (primary) hypertension: Secondary | ICD-10-CM

## 2020-10-11 DIAGNOSIS — E782 Mixed hyperlipidemia: Secondary | ICD-10-CM

## 2020-10-11 LAB — BAYER DCA HB A1C WAIVED: HB A1C (BAYER DCA - WAIVED): 6.2 % (ref <7.0)

## 2020-10-12 LAB — CMP14+EGFR
ALT: 31 IU/L (ref 0–44)
AST: 21 IU/L (ref 0–40)
Albumin/Globulin Ratio: 2.2 (ref 1.2–2.2)
Albumin: 4.9 g/dL (ref 3.8–4.9)
Alkaline Phosphatase: 58 IU/L (ref 44–121)
BUN/Creatinine Ratio: 15 (ref 9–20)
BUN: 10 mg/dL (ref 6–24)
Bilirubin Total: 0.9 mg/dL (ref 0.0–1.2)
CO2: 22 mmol/L (ref 20–29)
Calcium: 9.6 mg/dL (ref 8.7–10.2)
Chloride: 98 mmol/L (ref 96–106)
Creatinine, Ser: 0.68 mg/dL — ABNORMAL LOW (ref 0.76–1.27)
Globulin, Total: 2.2 g/dL (ref 1.5–4.5)
Glucose: 148 mg/dL — ABNORMAL HIGH (ref 65–99)
Potassium: 4.3 mmol/L (ref 3.5–5.2)
Sodium: 137 mmol/L (ref 134–144)
Total Protein: 7.1 g/dL (ref 6.0–8.5)
eGFR: 109 mL/min/{1.73_m2} (ref >59)

## 2020-10-12 LAB — LIPID PANEL
Chol/HDL Ratio: 3.4 ratio (ref 0.0–5.0)
Cholesterol, Total: 106 mg/dL (ref 100–199)
HDL: 31 mg/dL — ABNORMAL LOW (ref >39)
LDL Chol Calc (NIH): 52 mg/dL (ref 0–99)
Triglycerides: 129 mg/dL (ref 0–149)
VLDL Cholesterol Cal: 23 mg/dL (ref 5–40)

## 2020-11-04 ENCOUNTER — Other Ambulatory Visit: Payer: Self-pay

## 2020-11-04 ENCOUNTER — Ambulatory Visit: Payer: BC Managed Care – PPO | Admitting: Dermatology

## 2020-11-04 ENCOUNTER — Encounter: Payer: Self-pay | Admitting: Dermatology

## 2020-11-04 DIAGNOSIS — D485 Neoplasm of uncertain behavior of skin: Secondary | ICD-10-CM

## 2020-11-04 DIAGNOSIS — D1721 Benign lipomatous neoplasm of skin and subcutaneous tissue of right arm: Secondary | ICD-10-CM | POA: Diagnosis not present

## 2020-11-04 DIAGNOSIS — D179 Benign lipomatous neoplasm, unspecified: Secondary | ICD-10-CM

## 2020-11-04 DIAGNOSIS — L720 Epidermal cyst: Secondary | ICD-10-CM | POA: Diagnosis not present

## 2020-11-04 DIAGNOSIS — Z1283 Encounter for screening for malignant neoplasm of skin: Secondary | ICD-10-CM

## 2020-11-04 NOTE — Patient Instructions (Signed)

## 2020-11-07 ENCOUNTER — Other Ambulatory Visit: Payer: Self-pay | Admitting: Family Medicine

## 2020-11-07 DIAGNOSIS — R208 Other disturbances of skin sensation: Secondary | ICD-10-CM

## 2020-11-17 ENCOUNTER — Encounter: Payer: Self-pay | Admitting: Dermatology

## 2020-11-17 ENCOUNTER — Other Ambulatory Visit: Payer: Self-pay | Admitting: Family Medicine

## 2020-11-17 DIAGNOSIS — I10 Essential (primary) hypertension: Secondary | ICD-10-CM

## 2020-11-17 NOTE — Progress Notes (Signed)
   Follow-Up Visit   Subjective  Ricardo Luna. is a 55 y.o. male who presents for the following: New Patient (Initial Visit) (Patient here today for lesion on the right outer eye x years no bleeding, no pain. Patient also has a lesion on his left outer eye x 6 months per patient it has drained some. Check lesion on right upper inner arm x 5 years no bleeding no pain. ).  Growth outside right arm plus several areas to check. Location:  Duration:  Quality:  Associated Signs/Symptoms: Modifying Factors:  Severity:  Timing: Context:   Objective  Well appearing patient in no apparent distress; mood and affect are within normal limits. Waist up examination, no atypical pigmented lesions  Right Lateral Canthus Pearly 4 mm papule       Right Upper Arm - Anterior Firm Subcutaneous 1.5centimeters mobile nodule in    All skin waist up examined.   Assessment & Plan    Neoplasm of uncertain behavior of skin Right Lateral Canthus  Skin / nail biopsy Type of biopsy: tangential   Informed consent: discussed and consent obtained   Timeout: patient name, date of birth, surgical site, and procedure verified   Procedure prep:  Patient was prepped and draped in usual sterile fashion (Non sterile) Prep type:  Chlorhexidine Anesthesia: the lesion was anesthetized in a standard fashion   Anesthetic:  1% lidocaine w/ epinephrine 1-100,000 local infiltration Instrument used: flexible razor blade   Outcome: patient tolerated procedure well   Post-procedure details: wound care instructions given    Specimen 1 - Surgical pathology Differential Diagnosis: R/O Cyst vs Tag - two specimens one bottle Cautery after biopsy  Check Margins: No  Fibrolipoma Right Upper Arm - Anterior  May leave if stable or, if he chooses,, schedule 30 minutes for excision.  Encounter for screening for malignant neoplasm of skin  Annual skin examination, encouraged to self examine twice annually.   Continued ultraviolet protection.      I, Lavonna Monarch, MD, have reviewed all documentation for this visit.  The documentation on 11/17/20 for the exam, diagnosis, procedures, and orders are all accurate and complete.

## 2021-01-02 ENCOUNTER — Encounter: Payer: BC Managed Care – PPO | Admitting: Dermatology

## 2021-01-10 ENCOUNTER — Other Ambulatory Visit: Payer: Self-pay | Admitting: Family Medicine

## 2021-01-10 DIAGNOSIS — R208 Other disturbances of skin sensation: Secondary | ICD-10-CM

## 2021-01-13 ENCOUNTER — Other Ambulatory Visit: Payer: Self-pay | Admitting: Family Medicine

## 2021-01-13 DIAGNOSIS — R208 Other disturbances of skin sensation: Secondary | ICD-10-CM

## 2021-02-12 ENCOUNTER — Other Ambulatory Visit: Payer: Self-pay | Admitting: Family Medicine

## 2021-02-12 DIAGNOSIS — E1165 Type 2 diabetes mellitus with hyperglycemia: Secondary | ICD-10-CM

## 2021-02-27 ENCOUNTER — Ambulatory Visit (INDEPENDENT_AMBULATORY_CARE_PROVIDER_SITE_OTHER): Payer: BC Managed Care – PPO | Admitting: Dermatology

## 2021-02-27 ENCOUNTER — Other Ambulatory Visit: Payer: Self-pay

## 2021-02-27 ENCOUNTER — Encounter: Payer: Self-pay | Admitting: Dermatology

## 2021-02-27 DIAGNOSIS — L720 Epidermal cyst: Secondary | ICD-10-CM

## 2021-02-27 DIAGNOSIS — H02826 Cysts of left eye, unspecified eyelid: Secondary | ICD-10-CM

## 2021-02-27 DIAGNOSIS — D485 Neoplasm of uncertain behavior of skin: Secondary | ICD-10-CM

## 2021-02-27 NOTE — Patient Instructions (Signed)

## 2021-03-18 ENCOUNTER — Encounter: Payer: Self-pay | Admitting: Family Medicine

## 2021-03-18 ENCOUNTER — Ambulatory Visit: Payer: BC Managed Care – PPO | Admitting: Family Medicine

## 2021-03-18 VITALS — BP 126/77 | HR 85 | Temp 97.9°F | Ht 73.0 in | Wt 221.0 lb

## 2021-03-18 DIAGNOSIS — Z1211 Encounter for screening for malignant neoplasm of colon: Secondary | ICD-10-CM

## 2021-03-18 DIAGNOSIS — E1165 Type 2 diabetes mellitus with hyperglycemia: Secondary | ICD-10-CM

## 2021-03-18 DIAGNOSIS — R208 Other disturbances of skin sensation: Secondary | ICD-10-CM

## 2021-03-18 DIAGNOSIS — E782 Mixed hyperlipidemia: Secondary | ICD-10-CM | POA: Diagnosis not present

## 2021-03-18 DIAGNOSIS — K219 Gastro-esophageal reflux disease without esophagitis: Secondary | ICD-10-CM

## 2021-03-18 DIAGNOSIS — H6121 Impacted cerumen, right ear: Secondary | ICD-10-CM

## 2021-03-18 DIAGNOSIS — I10 Essential (primary) hypertension: Secondary | ICD-10-CM | POA: Diagnosis not present

## 2021-03-18 LAB — LIPID PANEL

## 2021-03-18 LAB — BAYER DCA HB A1C WAIVED: HB A1C (BAYER DCA - WAIVED): 6.6 % — ABNORMAL HIGH (ref 4.8–5.6)

## 2021-03-18 MED ORDER — ATORVASTATIN CALCIUM 10 MG PO TABS: 10.0000 mg | ORAL_TABLET | Freq: Every day | ORAL | 1 refills | Status: DC

## 2021-03-18 MED ORDER — GABAPENTIN 100 MG PO CAPS: 100.0000 mg | ORAL_CAPSULE | Freq: Every day | ORAL | 5 refills | Status: DC | PRN

## 2021-03-18 MED ORDER — LISINOPRIL 20 MG PO TABS: 20.0000 mg | ORAL_TABLET | Freq: Every day | ORAL | 1 refills | Status: DC

## 2021-03-18 MED ORDER — JANUMET XR 50-1000 MG PO TB24: 1.0000 | ORAL_TABLET | Freq: Two times a day (BID) | ORAL | 1 refills | Status: DC

## 2021-03-18 MED ORDER — HYDROCHLOROTHIAZIDE 50 MG PO TABS: 50.0000 mg | ORAL_TABLET | Freq: Every day | ORAL | 1 refills | Status: DC

## 2021-03-18 NOTE — Patient Instructions (Signed)
Purchase Debrox wax removal kit over the counter. Drops (3-4) should be instilled twice daily x3 days, then the ear flushed with warm water on the 4th day.   Please schedule your diabetic eye exam.

## 2021-03-18 NOTE — Progress Notes (Signed)
Assessment & Plan:  1. Type 2 diabetes mellitus with hyperglycemia, without long-term current use of insulin (HCC) Lab Results  Component Value Date   HGBA1C 6.6 (H) 03/18/2021   HGBA1C 6.2 10/11/2020   HGBA1C 8.4 (H) 06/28/2020    - Diabetes is at goal of A1c < 7. - Medications:  combination tablet of metformin and januvia prescribed to decrease pill burden - Patient is currently taking a statin. Patient is taking an ACE-inhibitor/ARB.  - Instruction/counseling given: reminded to get eye exam  Diabetes Health Maintenance Due  Topic Date Due   OPHTHALMOLOGY EXAM  Never done   FOOT EXAM  08/28/2021   HEMOGLOBIN A1C  09/15/2021    No results found for: LABMICR, MICROALBUR - Lipid panel - CBC with Differential/Platelet - CMP14+EGFR - Bayer DCA Hb A1c Waived - atorvastatin (LIPITOR) 10 MG tablet; Take 1 tablet (10 mg total) by mouth daily.  Dispense: 90 tablet; Refill: 1 - SitaGLIPtin-MetFORMIN HCl (JANUMET XR) 50-1000 MG TB24; Take 1 tablet by mouth 2 (two) times daily.  Dispense: 180 tablet; Refill: 1  2. Essential hypertension Well controlled on current regimen.  - Lipid panel - CBC with Differential/Platelet - CMP14+EGFR - hydrochlorothiazide (HYDRODIURIL) 50 MG tablet; Take 1 tablet (50 mg total) by mouth daily.  Dispense: 90 tablet; Refill: 1 - lisinopril (ZESTRIL) 20 MG tablet; Take 1 tablet (20 mg total) by mouth daily.  Dispense: 90 tablet; Refill: 1  3. Mixed hyperlipidemia Well controlled on current regimen.   - Lipid panel - CMP14+EGFR - atorvastatin (LIPITOR) 10 MG tablet; Take 1 tablet (10 mg total) by mouth daily.  Dispense: 90 tablet; Refill: 1  4. Burning sensation of feet Well controlled on current regimen.  - CMP14+EGFR - gabapentin (NEURONTIN) 100 MG capsule; Take 1-3 capsules (100-300 mg total) by mouth daily as needed.  Dispense: 90 capsule; Refill: 5  5. Gastroesophageal reflux disease, unspecified whether esophagitis present Well controlled on  current regimen.  - CMP14+EGFR  6. Impacted cerumen of right ear Encouraged to purchase Debrox wax removal kit over the counter. Drops (3-4) should be instilled twice daily x3 days, then the ear flushed with warm water on the 4th day.   7. Colon cancer screening - Ambulatory referral to Gastroenterology   Follow-up: Return in about 6 months (around 09/15/2021) for annual physical.   Hendricks Limes, MSN, APRN, FNP-C [facility]  Subjective:  Patient ID: Ricardo Luna., male    DOB: [birth year]  Age: 56 y.o. MRN: [id number]  Patient Care Team: Loman Brooklyn, FNP as PCP - General (Family Medicine) Harlen Labs, MD as Referring Physician (Optometry) Lavonna Monarch, MD as Consulting Physician (Dermatology)  CC:  Chief Complaint  Patient presents with   Medical Management of Chronic Issues   tickle in ear    On and off tickle in right ear while driving.     HPI Ordell Prichett. presents to follow-up on his chronic medical conditions.   Hypertension: Controlled with hydrochlorothiazide and lisinopril.  Hyperlipidemia: Patient is taking fish oil 1000 mg and atorvastatin 10 mg once daily.  Diabetes: Patient presents for follow up of diabetes. Current symptoms include: hyperglycemia. Known diabetic complications: peripheral neuropathy. Medication compliance: Yes - metformin 1,000 mg BID and Januvia 50 mg daily. Is he  on ACE inhibitor or angiotensin II receptor blocker? Yes (Lisinopril). Is he on a statin? Yes (Atorvastatin).   GERD: Taking Prilosec 20 mg daily.  Burning sensation of feet: Relieved  with gabapentin 100 mg as needed.  New complaints: Patient reports a tickle in his right ear that occurs randomly.  He states it happens when he is driving down the road with the windows up, but that it almost feels like a hair moving in the wind.  Review of Systems  Constitutional:  Negative for chills, fever, malaise/fatigue and weight loss.  HENT:   Negative for congestion, ear discharge, ear pain, nosebleeds, sinus pain, sore throat and tinnitus.   Eyes:  Negative for blurred vision, double vision, pain, discharge and redness.  Respiratory:  Negative for cough, shortness of breath and wheezing.   Cardiovascular:  Negative for chest pain, palpitations and leg swelling.  Gastrointestinal:  Negative for abdominal pain, constipation, diarrhea, heartburn, nausea and vomiting.  Genitourinary:  Negative for dysuria, frequency and urgency.  Musculoskeletal:  Negative for myalgias.  Skin:  Negative for rash.  Neurological:  Negative for dizziness, seizures, weakness and headaches.  Psychiatric/Behavioral:  Negative for depression, substance abuse and suicidal ideas. The patient is not nervous/anxious.     Current Outpatient Medications:    ACCU-CHEK GUIDE test strip, USE 1 STRIP TO CHECK GLUCOSE UP TO 4 TIMES DAILY AS DIRECTED, Disp: , Rfl:    Accu-Chek Softclix Lancets lancets, SMARTSIG:Topical 1-4 Times Daily, Disp: , Rfl:    aspirin EC 81 MG tablet, Take 81 mg by mouth daily. Swallow whole., Disp: , Rfl:    atorvastatin (LIPITOR) 10 MG tablet, Take 1 tablet (10 mg total) by mouth daily., Disp: 90 tablet, Rfl: 1   blood glucose meter kit and supplies KIT, Dispense based on patient and insurance preference. Use up to four times daily as directed., Disp: 1 each, Rfl: 0   gabapentin (NEURONTIN) 100 MG capsule, Take 1-3 capsules (100-300 mg total) by mouth daily as needed. (NEEDS TO BE SEEN BEFORE NEXT REFILL), Disp: 30 capsule, Rfl: 0   hydrochlorothiazide (HYDRODIURIL) 50 MG tablet, Take 1 tablet (50 mg total) by mouth daily., Disp: 90 tablet, Rfl: 1   lisinopril (ZESTRIL) 20 MG tablet, Take 1 tablet by mouth once daily, Disp: 90 tablet, Rfl: 0   metFORMIN (GLUCOPHAGE) 1000 MG tablet, Take 1 tablet (1,000 mg total) by mouth 2 (two) times daily. (NEEDS TO BE SEEN BEFORE NEXT REFILL), Disp: 60 tablet, Rfl: 0   Omega-3 Fatty Acids (FISH OIL) 1000 MG  CAPS, Take 1,000 mg by mouth daily., Disp: , Rfl:    omeprazole (PRILOSEC) 20 MG capsule, Take 1 capsule (20 mg total) by mouth daily., Disp: 90 capsule, Rfl: 1   sitaGLIPtin (JANUVIA) 50 MG tablet, Take 1 tablet (50 mg total) by mouth daily., Disp: 90 tablet, Rfl: 1  Allergies  Allergen Reactions   Penicillins     Past Medical History:  Diagnosis Date   Diverticulitis    Hypercholesteremia    Hypertension    Type 2 diabetes mellitus ([place])     History reviewed. No pertinent surgical history.  Family History  Problem Relation Age of Onset   Hypertension Mother    Diabetes Mother    Stomach cancer Mother    Diabetes Father    Hypertension Father    Thyroid cancer Daughter     Social History   Socioeconomic History   Marital status: Single    Spouse name: Not on file   Number of children: Not on file   Years of education: Not on file   Highest education level: Not on file  Occupational History   Not on file  Tobacco  Use   Smoking status: Every Day    Packs/day: 1.00    Types: Cigarettes   Smokeless tobacco: Never  Vaping Use   Vaping Use: Never used  Substance and Sexual Activity   Alcohol use: Yes    Comment: every other day, 2-3 beers at a time   Drug use: No   Sexual activity: Yes    Birth control/protection: None  Other Topics Concern   Not on file  Social History Narrative   Not on file   Social Determinants of Health   Financial Resource Strain: Not on file  Food Insecurity: Not on file  Transportation Needs: Not on file  Physical Activity: Not on file  Stress: Not on file  Social Connections: Not on file  Intimate Partner Violence: Not on file    Objective:   Today's Vitals: BP 126/77    Pulse 85    Temp 97.9 F (36.6 C) (Temporal)    Ht 6' 1" (1.854 m)    Wt 221 lb (100.2 kg)    SpO2 96%    BMI 29.16 kg/m   Physical Exam Vitals reviewed.  Constitutional:      General: He is not in acute distress.    Appearance: Normal appearance. He  is overweight. He is not ill-appearing, toxic-appearing or diaphoretic.  HENT:     Head: Normocephalic and atraumatic.     Right Ear: External ear normal. There is impacted cerumen.     Left Ear: Tympanic membrane, ear canal and external ear normal. There is no impacted cerumen.  Eyes:     General: No scleral icterus.       Right eye: No discharge.        Left eye: No discharge.     Conjunctiva/sclera: Conjunctivae normal.  Cardiovascular:     Rate and Rhythm: Normal rate and regular rhythm.     Heart sounds: Normal heart sounds. No murmur heard.   No friction rub. No gallop.  Pulmonary:     Effort: Pulmonary effort is normal. No respiratory distress.     Breath sounds: Normal breath sounds. No stridor. No wheezing, rhonchi or rales.  Musculoskeletal:        General: Normal range of motion.     Cervical back: Normal range of motion.  Skin:    General: Skin is warm and dry.  Neurological:     Mental Status: He is alert and oriented to person, place, and time. Mental status is at baseline.  Psychiatric:        Mood and Affect: Mood normal.        Behavior: Behavior normal.        Thought Content: Thought content normal.        Judgment: Judgment normal.

## 2021-03-19 LAB — CBC WITH DIFFERENTIAL/PLATELET
Basophils Absolute: 0 10*3/uL (ref 0.0–0.2)
Basos: 1 %
EOS (ABSOLUTE): 0.4 10*3/uL (ref 0.0–0.4)
Eos: 4 %
Hematocrit: 40.9 % (ref 37.5–51.0)
Hemoglobin: 14.3 g/dL (ref 13.0–17.7)
Immature Grans (Abs): 0 10*3/uL (ref 0.0–0.1)
Immature Granulocytes: 0 %
Lymphocytes Absolute: 3.7 10*3/uL — ABNORMAL HIGH (ref 0.7–3.1)
Lymphs: 42 %
MCH: 32.6 pg (ref 26.6–33.0)
MCHC: 35 g/dL (ref 31.5–35.7)
MCV: 93 fL (ref 79–97)
Monocytes Absolute: 0.7 10*3/uL (ref 0.1–0.9)
Monocytes: 8 %
Neutrophils Absolute: 4 10*3/uL (ref 1.4–7.0)
Neutrophils: 45 %
Platelets: 242 10*3/uL (ref 150–450)
RBC: 4.38 x10E6/uL (ref 4.14–5.80)
RDW: 12.6 % (ref 11.6–15.4)
WBC: 8.8 10*3/uL (ref 3.4–10.8)

## 2021-03-19 LAB — CMP14+EGFR
ALT: 34 IU/L (ref 0–44)
AST: 25 IU/L (ref 0–40)
Albumin/Globulin Ratio: 2.3 — ABNORMAL HIGH (ref 1.2–2.2)
Albumin: 5.1 g/dL — ABNORMAL HIGH (ref 3.8–4.9)
Alkaline Phosphatase: 57 IU/L (ref 44–121)
BUN/Creatinine Ratio: 12 (ref 9–20)
BUN: 11 mg/dL (ref 6–24)
Bilirubin Total: 0.6 mg/dL (ref 0.0–1.2)
CO2: 24 mmol/L (ref 20–29)
Calcium: 9.6 mg/dL (ref 8.7–10.2)
Chloride: 96 mmol/L (ref 96–106)
Creatinine, Ser: 0.91 mg/dL (ref 0.76–1.27)
Globulin, Total: 2.2 g/dL (ref 1.5–4.5)
Glucose: 156 mg/dL — ABNORMAL HIGH (ref 70–99)
Potassium: 4.1 mmol/L (ref 3.5–5.2)
Sodium: 137 mmol/L (ref 134–144)
Total Protein: 7.3 g/dL (ref 6.0–8.5)
eGFR: 98 mL/min/{1.73_m2} (ref >59)

## 2021-03-19 LAB — LIPID PANEL
Chol/HDL Ratio: 3.6 ratio (ref 0.0–5.0)
Cholesterol, Total: 114 mg/dL (ref 100–199)
HDL: 32 mg/dL — ABNORMAL LOW (ref >39)
LDL Chol Calc (NIH): 46 mg/dL (ref 0–99)
Triglycerides: 224 mg/dL — ABNORMAL HIGH (ref 0–149)
VLDL Cholesterol Cal: 36 mg/dL (ref 5–40)

## 2021-03-20 ENCOUNTER — Encounter (INDEPENDENT_AMBULATORY_CARE_PROVIDER_SITE_OTHER): Payer: Self-pay | Admitting: *Deleted

## 2021-03-22 ENCOUNTER — Encounter: Payer: Self-pay | Admitting: Dermatology

## 2021-03-22 NOTE — Progress Notes (Signed)
° °  Follow-Up Visit   Subjective  Ricardo Luna. is a 56 y.o. male who presents for the following: Procedure (Cyst removal left outer eye. ).  Cyst left lateral canthus which patient wants removed Location:  Duration:  Quality:  Associated Signs/Symptoms: Modifying Factors:  Severity:  Timing: Context:   Objective  Well appearing patient in no apparent distress; mood and affect are within normal limits. Left Lateral Canthus  Left Lateral Canthus 7 mm upper dermal white Papule; patient requests removal.  Told preoperatively risks of scarring, infection recurrence, noncoverage by health insurance       A focused examination was performed including face. Relevant physical exam findings are noted in the Assessment and Plan.   Assessment & Plan    Epidermoid cyst of eyelid, left Left Lateral Canthus  The lesion was opened with narrow fusiform incision and dissected out with scissors, slight fibrotic base.  light cautery to coapt edges rather than sutures.  Skin excision - Left Lateral Canthus  Lesion length (cm):  0.9 Lesion width (cm):  0.9 Margin per side (cm):  0 Total excision diameter (cm):  0.9 Informed consent: discussed and consent obtained   Timeout: patient name, date of birth, surgical site, and procedure verified   Anesthesia: the lesion was anesthetized in a standard fashion   Anesthetic:  1% lidocaine w/ epinephrine 1-100,000 local infiltration Instrument used: #15 blade   Hemostasis achieved with: pressure, ferric subsulfate and electrodesiccation   Outcome: patient tolerated procedure well with no complications   Post-procedure details: sterile dressing applied and wound care instructions given   Dressing type: bandage, petrolatum and pressure dressing   Additional details:  No sutures per ST  Specimen 1 - Surgical pathology Differential Diagnosis: R/O Cyst - excision  Check Margins: No      I, Lavonna Monarch, MD, have reviewed all  documentation for this visit.  The documentation on 03/22/21 for the exam, diagnosis, procedures, and orders are all accurate and complete.

## 2021-03-24 ENCOUNTER — Encounter: Payer: Self-pay | Admitting: Family Medicine

## 2021-04-18 ENCOUNTER — Other Ambulatory Visit: Payer: Self-pay | Admitting: Family Medicine

## 2021-04-18 DIAGNOSIS — K219 Gastro-esophageal reflux disease without esophagitis: Secondary | ICD-10-CM

## 2021-07-02 ENCOUNTER — Encounter: Payer: Self-pay | Admitting: Family Medicine

## 2021-07-02 ENCOUNTER — Ambulatory Visit: Payer: BC Managed Care – PPO | Admitting: Family Medicine

## 2021-07-02 VITALS — BP 121/74 | HR 89 | Temp 98.3°F | Ht 73.0 in | Wt 220.0 lb

## 2021-07-02 DIAGNOSIS — W57XXXA Bitten or stung by nonvenomous insect and other nonvenomous arthropods, initial encounter: Secondary | ICD-10-CM | POA: Diagnosis not present

## 2021-07-02 DIAGNOSIS — S80261A Insect bite (nonvenomous), right knee, initial encounter: Secondary | ICD-10-CM | POA: Diagnosis not present

## 2021-07-02 MED ORDER — DOXYCYCLINE HYCLATE 100 MG PO TABS: 100.0000 mg | ORAL_TABLET | Freq: Two times a day (BID) | ORAL | 0 refills | Status: AC

## 2021-07-02 NOTE — Progress Notes (Signed)
? ?  Acute Office Visit ? ?Subjective:  ? ?  ?Patient ID: Ricardo Luna., male    DOB: 11-22-1965, 56 y.o.   MRN: [id number] ? ?Chief Complaint  ?Patient presents with  ? Tick Removal  ? ? ?HPI ?Patient is in today for a tick bite on his right knee. He removed the tick 2 days ago. He does not know how long it was attached, but it was not very engorged. He isn't sure what type of tick it was. He has had swelling and warmth to his knee and upper thigh since then. The swelling and warmth has improved some. His knee is sore. There has been purulent drainage from where the tick was removed. He reports that he has a history of significant reactions to insect bites of any type. He denies fever, chills, nausea, vomiting, HA, neck pain, or neurological symptoms.  ? ?ROS ?As per HPI.  ? ?   ?Objective:  ?  ?BP 121/74   Pulse 89   Temp 98.3 ?F (36.8 ?C) (Temporal)   Ht '6\' 1"'$  (1.854 m)   Wt 220 lb (99.8 kg)   SpO2 96%   BMI 29.03 kg/m?  ? ? ?Physical Exam ?Vitals and nursing note reviewed.  ?Constitutional:   ?   General: He is not in acute distress. ?   Appearance: He is not ill-appearing, toxic-appearing or diaphoretic.  ?HENT:  ?   Mouth/Throat:  ?   Mouth: Mucous membranes are moist.  ?   Pharynx: Oropharynx is clear.  ?Eyes:  ?   Extraocular Movements: Extraocular movements intact.  ?   Conjunctiva/sclera: Conjunctivae normal.  ?   Pupils: Pupils are equal, round, and reactive to light.  ?Pulmonary:  ?   Effort: Pulmonary effort is normal. No respiratory distress.  ?Musculoskeletal:  ?   Cervical back: Neck supple. No rigidity.  ?   Right knee: Swelling (generalized) present. No deformity, effusion, erythema, ecchymosis or bony tenderness. Normal range of motion. Tenderness (generalized) present.  ?   Right lower leg: No edema.  ?   Left lower leg: No edema.  ?Skin: ?   General: Skin is warm and dry.  ?   Findings: Wound (right knee with purulent drainage.) present.  ?Neurological:  ?   General: No focal deficit  present.  ?   Mental Status: He is alert and oriented to person, place, and time.  ?   Motor: No weakness.  ?   Gait: Gait normal.  ?Psychiatric:     ?   Mood and Affect: Mood normal.     ?   Behavior: Behavior normal.  ? ? ?No results found for any visits on 07/02/21. ? ? ?   ?Assessment & Plan:  ? ?Ricardo Luna was seen today for tick removal. ? ?Diagnoses and all orders for this visit: ? ?Tick bite of right knee, initial encounter ?Removed 2 days ago. Purulent drainage with warmth, tenderness, and swelling of right knee where tick was removed. No other symptoms. Doxycyline as below. Strict return precautions given.  ?-     doxycycline (VIBRA-TABS) 100 MG tablet; Take 1 tablet (100 mg total) by mouth 2 (two) times daily for 10 days. ? ? ?Return if symptoms worsen or fail to improve. ? ?The patient indicates understanding of these issues and agrees with the plan. ? ?Gwenlyn Perking, FNP ? ? ?

## 2021-07-02 NOTE — Patient Instructions (Signed)
Tick Bite Information, Adult Ticks are insects that draw blood for food. Most ticks live in shrubs and grassy and wooded areas. They climb onto people and animals that brush against the leaves and grasses that they rest on. Then they bite, attaching themselves to the skin. Most ticks are harmless, but some ticks may carry germs that can spread to a person through a bite and cause a disease. To reduce your risk of getting a disease from a tick bite, make sure you: Take steps to prevent tick bites. Check for ticks after being outdoors where ticks live. Watch for symptoms of disease if a tick attached to you or if you suspect a tick bite. How can I prevent tick bites? Take these steps to help prevent tick bites when you go outdoors in an area where ticks live: Use insect repellent Use insect repellent that has DEET (20% or higher), picaridin, or IR3535 in it. Follow the instructions on the label. Use these products on: Bare skin. The top of your boots. Your pant legs. Your sleeve cuffs. For insect repellent that contains permethrin, follow the instructions on the label. Use these products on: Clothing. Boots. Outdoor gear. Tents. When you are outside Wear protective clothing. Long sleeves and long pants offer the best protection from ticks. Wear light-colored clothing so you can see ticks more easily. Tuck your pant legs into your socks. If you go walking on a trail, stay in the middle of the trail so your skin, hair, and clothing do not touch the bushes. Avoid walking through areas with long grass. Check for ticks on your clothing, hair, and skin often while you are outside, and check again before you go inside. Make sure to check the scalp, neck, armpits, waist, groin, and joint areas. These are the spots where ticks attach themselves most often. When you go indoors Check your clothing for ticks. Tumble dry clothes in a dryer on high heat for at least 10 minutes. If clothes are damp,  additional time may be needed. If clothes require washing, use hot water. Examine gear and pets. Shower soon after being outdoors. Check your body for ticks. Conduct a full body check using a mirror. What is the proper way to remove a tick? If you find a tick on your body, remove it as soon as possible. Removing a tick sooner can prevent germs from passing to your body. Do not remove the tick with your bare fingers. To remove a tick that is crawling on your skin but has not bitten, use either of these methods: Go outdoors and brush the tick off. Remove the tick with tape or a lint roller. To remove a tick that is attached to your skin: Wash your hands. If you have latex gloves, put them on. Use fine-tipped tweezers, curved forceps, or a tick-removal tool to gently grasp the tick as close to your skin and the tick's head as possible. Gently pull with a steady, upward, even pressure until the tick lets go. When removing the tick: Take care to keep the tick's head attached to its body. Do not twist or jerk the tick. This can make the tick's head or mouth parts break off and remain in the skin. Do not squeeze or crush the tick's body. This could force disease-carrying fluids from the tick into your body. Do not try to remove a tick with heat, alcohol, petroleum jelly, or fingernail polish. Using these methods can cause the tick to salivate and regurgitate into your bloodstream,   increasing your risk of getting a disease. What should I do after removing a tick? Dispose of the tick. Do not crush a tick with your fingers. Clean the bite area and your hands with soap and water, rubbing alcohol, or an iodine scrub. If an antiseptic cream or ointment is available, apply a small amount to the bite site. Wash and disinfect any instruments that you used to remove the tick. How should I dispose of a tick? To dispose of a live tick, use one of these methods: Place it in rubbing alcohol. Place it in a sealed  bag or container. Wrap it tightly in tape. Flush it down the toilet. Contact a health care provider if: You have symptoms of a disease after a tick bite. Symptoms of a tick-borne disease can occur from moments after the tick bites to 30 days after a tick is removed. Symptoms include: Fever or chills. Any of these signs in the bite area: A red rash that makes a circle (bull's-eye rash) in the bite area. Redness and swelling. Headache. Muscle, joint, or bone pain. Abnormal tiredness. Numbness in your legs or difficulty walking or moving your legs. Tender, swollen lymph glands. A part of a tick breaks off and gets stuck in your skin. Get help right away if: You are not able to remove a tick. You experience muscle weakness or paralysis. Your symptoms get worse or you experience new symptoms. You find an engorged tick on your skin and you are in an area where disease from ticks is a high risk. Summary Ticks may carry germs that can spread to a person through a bite and cause a disease. Wear protective clothing and use insect repellent to prevent tick bites. Follow the instructions on the label. If you find a tick on your body, remove it as soon as possible. If the tick is attached, do not try to remove with heat, alcohol, petroleum jelly, or fingernail polish. Remove the attached tick using fine-tipped tweezers, curved forceps, or a tick-removal tool. Gently pull with steady, upward, even pressure until the tick lets go. Do not twist or jerk the tick. Do not squeeze or crush the tick's body. If you have symptoms of a disease after being bitten by a tick, contact a health care provider. This information is not intended to replace advice given to you by your health care provider. Make sure you discuss any questions you have with your health care provider. Document Revised: 01/30/2019 Document Reviewed: 01/30/2019 Elsevier Patient Education  2023 [employer]  

## 2021-07-03 ENCOUNTER — Ambulatory Visit: Payer: BC Managed Care – PPO | Admitting: Family Medicine

## 2021-08-21 ENCOUNTER — Encounter: Payer: Self-pay | Admitting: Physician Assistant

## 2021-08-21 ENCOUNTER — Ambulatory Visit (INDEPENDENT_AMBULATORY_CARE_PROVIDER_SITE_OTHER): Payer: BC Managed Care – PPO | Admitting: Physician Assistant

## 2021-08-21 VITALS — Ht 72.0 in | Wt 225.0 lb

## 2021-08-21 DIAGNOSIS — L239 Allergic contact dermatitis, unspecified cause: Secondary | ICD-10-CM

## 2021-08-21 MED ORDER — METHYLPREDNISOLONE ACETATE 80 MG/ML IJ SUSP
80.0000 mg | Freq: Once | INTRAMUSCULAR | Status: AC
  Administered 2021-08-21: 80 mg via INTRAMUSCULAR

## 2021-08-21 NOTE — Patient Instructions (Signed)
Contact Dermatitis Dermatitis is redness, soreness, and swelling (inflammation) of the skin. Contact dermatitis is a reaction to something that touches the skin. There are two types of contact dermatitis: Irritant contact dermatitis. This happens when something bothers (irritates) your skin, like soap. Allergic contact dermatitis. This is caused when you are exposed to something that you are allergic to, such as poison ivy. What are the causes? Common causes of irritant contact dermatitis include: Makeup. Soaps. Detergents. Bleaches. Acids. Metals, such as nickel. Common causes of allergic contact dermatitis include: Plants. Chemicals. Jewelry. Latex. Medicines. Preservatives in products, such as clothing. What increases the risk? Having a job that exposes you to things that bother your skin. Having asthma or eczema. What are the signs or symptoms? Symptoms may happen anywhere the irritant has touched your skin. Symptoms include: Dry or flaky skin. Redness. Cracks. Itching. Pain or a burning feeling. Blisters. Blood or clear fluid draining from skin cracks. With allergic contact dermatitis, swelling may occur. This may happen in places such as the eyelids, mouth, or genitals. How is this treated? This condition is treated by checking for the cause of the reaction and protecting your skin. Treatment may also include: Steroid creams, ointments, or medicines. Antibiotic medicines or other ointments, if you have a skin infection. Lotion or medicines to help with itching. A bandage (dressing). Follow these instructions at home: Skin care Moisturize your skin as needed. Put cool cloths on your skin. Put a baking soda paste on your skin. Stir water into baking soda until it looks like a paste. Do not scratch your skin. Avoid having things rub up against your skin. Avoid the use of soaps, perfumes, and dyes. Medicines Take or apply over-the-counter and prescription medicines  only as told by your doctor. If you were prescribed an antibiotic medicine, take or apply it as told by your doctor. Do not stop using it even if your condition starts to get better. Bathing Take a bath with: Epsom salts. Baking soda. Colloidal oatmeal. Bathe less often. Bathe in warm water. Avoid using hot water. Bandage care If you were given a bandage, change it as told by your doctor. Wash your hands with soap and water before and after you change your bandage. If soap and water are not available, use hand sanitizer. General instructions Avoid the things that caused your reaction. If you do not know what caused it, keep a journal. Write down: What you eat. What skin products you use. What you drink. What you wear in the area that has symptoms. This includes jewelry. Check the affected areas every day for signs of infection. Check for: More redness, swelling, or pain. More fluid or blood. Warmth. Pus or a bad smell. Keep all follow-up visits as told by your doctor. This is important. Contact a doctor if: You do not get better with treatment. Your condition gets worse. You have signs of infection, such as: More swelling. Tenderness. More redness. Soreness. Warmth. You have a fever. You have new symptoms. Get help right away if: You have a very bad headache. You have neck pain. Your neck is stiff. You throw up (vomit). You feel very sleepy. You see red streaks coming from the area. Your bone or joint near the area hurts after the skin has healed. The area turns darker. You have trouble breathing. Summary Dermatitis is redness, soreness, and swelling of the skin. Symptoms may occur where the irritant has touched you. Treatment may include medicines and skin care. If you do not  know what caused your reaction, keep a journal. Contact a doctor if your condition gets worse or you have signs of infection. This information is not intended to replace advice given to you by  your health care provider. Make sure you discuss any questions you have with your health care provider. Document Revised: 11/18/2020 Document Reviewed: 11/18/2020 Elsevier Patient Education  [place].

## 2021-08-21 NOTE — Progress Notes (Signed)
  Subjective:     Patient ID: Ricardo Luna., male   DOB: August 11, 1965, 56 y.o.   MRN: [id number]  Poison Ivy   Pt with onset rash to the face and arms for 2 days Believed he has poison oak Was in the woods just prior to rash starting Hx of sig rxn No OTC meds for sx  Review of Systems  Skin:  Positive for rash.       Objective:   Physical Exam Vitals and nursing note reviewed.  Constitutional:      General: He is not in acute distress.    Appearance: Normal appearance. He is not ill-appearing or toxic-appearing.  Neurological:     Mental Status: He is alert.   Multiple vesicles lesions noted to the L side of the nose and surrounding the R eye Lesions also noted to the forearms bilat No periorbital edema noted     Assessment:     1. Allergic dermatitis        Plan:      Nl course reviewed Cool compresses DepoMedrol 80IM today OTC antihistamines F/U prn PATP

## 2021-09-28 ENCOUNTER — Other Ambulatory Visit: Payer: Self-pay | Admitting: Family Medicine

## 2021-09-28 DIAGNOSIS — E1165 Type 2 diabetes mellitus with hyperglycemia: Secondary | ICD-10-CM

## 2021-09-28 DIAGNOSIS — E782 Mixed hyperlipidemia: Secondary | ICD-10-CM

## 2021-09-29 NOTE — Telephone Encounter (Signed)
NTBS for 6 mos FU w/n next 4-6 wks w/ new provider. [name]. 30 days given.

## 2021-10-23 ENCOUNTER — Encounter: Payer: Self-pay | Admitting: Family Medicine

## 2021-10-23 ENCOUNTER — Other Ambulatory Visit: Payer: Self-pay | Admitting: Family Medicine

## 2021-10-23 DIAGNOSIS — E782 Mixed hyperlipidemia: Secondary | ICD-10-CM

## 2021-10-23 DIAGNOSIS — E1165 Type 2 diabetes mellitus with hyperglycemia: Secondary | ICD-10-CM

## 2021-10-23 NOTE — Telephone Encounter (Signed)
N/A Letter sent.

## 2021-10-23 NOTE — Telephone Encounter (Signed)
30 days given on 09/28/21 No appointment scheduled  Medication refused

## 2021-10-29 ENCOUNTER — Telehealth: Payer: Self-pay | Admitting: Family Medicine

## 2021-10-29 DIAGNOSIS — E782 Mixed hyperlipidemia: Secondary | ICD-10-CM

## 2021-10-29 DIAGNOSIS — E1165 Type 2 diabetes mellitus with hyperglycemia: Secondary | ICD-10-CM

## 2021-10-29 DIAGNOSIS — I10 Essential (primary) hypertension: Secondary | ICD-10-CM

## 2021-10-30 NOTE — Telephone Encounter (Signed)
Pt aware rx sent in and to keep his upcoming appt for further refills.

## 2021-11-05 ENCOUNTER — Ambulatory Visit: Payer: BC Managed Care – PPO | Admitting: Family Medicine

## 2021-11-05 ENCOUNTER — Encounter: Payer: Self-pay | Admitting: Family Medicine

## 2021-11-05 VITALS — BP 122/77 | HR 118 | Temp 98.1°F | Resp 20 | Ht 72.0 in | Wt 219.0 lb

## 2021-11-05 DIAGNOSIS — E1165 Type 2 diabetes mellitus with hyperglycemia: Secondary | ICD-10-CM | POA: Diagnosis not present

## 2021-11-05 DIAGNOSIS — Z86010 Personal history of colonic polyps: Secondary | ICD-10-CM

## 2021-11-05 DIAGNOSIS — K219 Gastro-esophageal reflux disease without esophagitis: Secondary | ICD-10-CM

## 2021-11-05 DIAGNOSIS — I10 Essential (primary) hypertension: Secondary | ICD-10-CM | POA: Diagnosis not present

## 2021-11-05 DIAGNOSIS — E782 Mixed hyperlipidemia: Secondary | ICD-10-CM

## 2021-11-05 DIAGNOSIS — R208 Other disturbances of skin sensation: Secondary | ICD-10-CM

## 2021-11-05 DIAGNOSIS — Z1211 Encounter for screening for malignant neoplasm of colon: Secondary | ICD-10-CM

## 2021-11-05 DIAGNOSIS — R252 Cramp and spasm: Secondary | ICD-10-CM

## 2021-11-05 MED ORDER — HYDROCHLOROTHIAZIDE 50 MG PO TABS: 50.0000 mg | ORAL_TABLET | Freq: Every day | ORAL | 1 refills | Status: DC

## 2021-11-05 MED ORDER — ATORVASTATIN CALCIUM 10 MG PO TABS: 10.0000 mg | ORAL_TABLET | Freq: Every day | ORAL | 1 refills | Status: DC

## 2021-11-05 MED ORDER — JANUMET XR 50-1000 MG PO TB24: 1.0000 | ORAL_TABLET | Freq: Two times a day (BID) | ORAL | 1 refills | Status: DC

## 2021-11-05 MED ORDER — LISINOPRIL 20 MG PO TABS: 20.0000 mg | ORAL_TABLET | Freq: Every day | ORAL | 1 refills | Status: DC

## 2021-11-05 NOTE — Progress Notes (Signed)
Assessment & Plan:  1. Type 2 diabetes mellitus with hyperglycemia, without long-term current use of insulin (HCC) A1c 7.0 today which has increased from 6.6 - Diabetes is not at goal of A1c < 7. - Medications: continue current medications, working hard on lifestyle modifications - Patient is currently taking a statin. Patient is taking an ACE-inhibitor/ARB.  - Instruction/counseling given: reminded to get eye exam and discussed diet  Diabetes Health Maintenance Due  Topic Date Due   OPHTHALMOLOGY EXAM  Never done   HEMOGLOBIN A1C  09/15/2021   FOOT EXAM  11/06/2022    No results found for: "LABMICR", "MICROALBUR" - Bayer DCA Hb A1c Waived - CBC with Differential/Platelet - CMP14+EGFR - Lipid panel - Microalbumin / creatinine urine ratio - atorvastatin (LIPITOR) 10 MG tablet; Take 1 tablet (10 mg total) by mouth daily.  Dispense: 90 tablet; Refill: 1 - SitaGLIPtin-MetFORMIN HCl (JANUMET XR) 50-1000 MG TB24; Take 1 tablet by mouth 2 (two) times daily.  Dispense: 180 tablet; Refill: 1  2. Essential hypertension Well controlled on current regimen.  - CBC with Differential/Platelet - CMP14+EGFR - Lipid panel - hydrochlorothiazide (HYDRODIURIL) 50 MG tablet; Take 1 tablet (50 mg total) by mouth daily.  Dispense: 90 tablet; Refill: 1 - lisinopril (ZESTRIL) 20 MG tablet; Take 1 tablet (20 mg total) by mouth daily.  Dispense: 90 tablet; Refill: 1  3. Mixed hyperlipidemia Well controlled on current regimen.  - CBC with Differential/Platelet - CMP14+EGFR - Lipid panel - atorvastatin (LIPITOR) 10 MG tablet; Take 1 tablet (10 mg total) by mouth daily.  Dispense: 90 tablet; Refill: 1  4. Gastroesophageal reflux disease, unspecified whether esophagitis present Well controlled on current regimen.  - CMP14+EGFR  5. Burning sensation of feet Well controlled on current regimen.  - CMP14+EGFR  6. Nocturnal foot cramps TSH and magnesium added on to today's lab work.   7. Colon cancer  screening - Ambulatory referral to Gastroenterology  8. History of adenomatous polyp of colon - Ambulatory referral to Gastroenterology   Follow-up: Return in about 4 months (around 03/07/2022) for follow-up of chronic medication conditions with Dr. Livia Snellen.   Hendricks Limes, MSN, APRN, FNP-C [facility]  Subjective:  Patient ID: Ricardo Luna., male    DOB: [birth year]  Age: 56 y.o. MRN: [id number]  Patient Care Team: Loman Brooklyn, FNP as PCP - General (Family Medicine) Harlen Labs, MD as Referring Physician (Optometry) Lavonna Monarch, MD (Inactive) as Consulting Physician (Dermatology)  CC:  Chief Complaint  Patient presents with   Medical Management of Chronic Issues    HPI Ricardo Luna. presents to follow-up on his chronic medical conditions.   Hypertension: Controlled with hydrochlorothiazide and lisinopril.  Hyperlipidemia: Patient is taking fish oil 1,000 mg and atorvastatin 10 mg once daily.  Diabetes: Patient presents for follow up of diabetes. Current symptoms include: hyperglycemia. Known diabetic complications: peripheral neuropathy. Medication compliance: Yes. Is he  on ACE inhibitor or angiotensin II receptor blocker? Yes (Lisinopril). Is he on a statin? Yes (Atorvastatin).   GERD: Taking Prilosec 20 mg daily.  Burning sensation of feet: Relieved with gabapentin 100 mg as needed.  New complaints: Patient reports he has been experiencing cramping in his feet at night. He has tried eating bananas, but that has not made a difference.    Review of Systems  Constitutional:  Negative for chills, fever, malaise/fatigue and weight loss.  HENT:  Negative for congestion, ear discharge, ear pain, nosebleeds, sinus pain, sore  throat and tinnitus.   Eyes:  Negative for blurred vision, double vision, pain, discharge and redness.  Respiratory:  Negative for cough, shortness of breath and wheezing.   Cardiovascular:  Negative for chest  pain, palpitations and leg swelling.  Gastrointestinal:  Negative for abdominal pain, constipation, diarrhea, heartburn, nausea and vomiting.  Genitourinary:  Negative for dysuria, frequency and urgency.  Musculoskeletal:  Negative for myalgias.  Skin:  Negative for rash.  Neurological:  Negative for dizziness, seizures, weakness and headaches.  Psychiatric/Behavioral:  Negative for depression, substance abuse and suicidal ideas. The patient is not nervous/anxious.      Current Outpatient Medications:    ACCU-CHEK GUIDE test strip, USE 1 STRIP TO CHECK GLUCOSE UP TO 4 TIMES DAILY AS DIRECTED, Disp: , Rfl:    Accu-Chek Softclix Lancets lancets, SMARTSIG:Topical 1-4 Times Daily, Disp: , Rfl:    aspirin EC 81 MG tablet, Take 81 mg by mouth daily. Swallow whole., Disp: , Rfl:    atorvastatin (LIPITOR) 10 MG tablet, Take 1 tablet (10 mg total) by mouth daily. (NEEDS TO BE SEEN BEFORE NEXT REFILL), Disp: 30 tablet, Rfl: 0   blood glucose meter kit and supplies KIT, Dispense based on patient and insurance preference. Use up to four times daily as directed., Disp: 1 each, Rfl: 0   gabapentin (NEURONTIN) 100 MG capsule, Take 1-3 capsules (100-300 mg total) by mouth daily as needed., Disp: 90 capsule, Rfl: 5   hydrochlorothiazide (HYDRODIURIL) 50 MG tablet, Take 1 tablet (50 mg total) by mouth daily. (NEEDS TO BE SEEN BEFORE NEXT REFILL), Disp: 30 tablet, Rfl: 0   lisinopril (ZESTRIL) 20 MG tablet, Take 1 tablet (20 mg total) by mouth daily. (NEEDS TO BE SEEN BEFORE NEXT REFILL), Disp: 30 tablet, Rfl: 0   omeprazole (PRILOSEC) 20 MG capsule, Take 1 capsule by mouth once daily, Disp: 90 capsule, Rfl: 0   SitaGLIPtin-MetFORMIN HCl (JANUMET XR) 50-1000 MG TB24, Take 1 tablet by mouth 2 (two) times daily. (NEEDS TO BE SEEN BEFORE NEXT REFILL), Disp: 60 tablet, Rfl: 0  Allergies  Allergen Reactions   Penicillins     Past Medical History:  Diagnosis Date   Diverticulitis    Hypercholesteremia     Hypertension    Type 2 diabetes mellitus ([place])     History reviewed. No pertinent surgical history.  Family History  Problem Relation Age of Onset   Hypertension Mother    Diabetes Mother    Stomach cancer Mother    Diabetes Father    Hypertension Father    Thyroid cancer Daughter     Social History   Socioeconomic History   Marital status: Single    Spouse name: Not on file   Number of children: Not on file   Years of education: Not on file   Highest education level: Not on file  Occupational History   Not on file  Tobacco Use   Smoking status: Every Day    Packs/day: 1.00    Types: Cigarettes   Smokeless tobacco: Never  Vaping Use   Vaping Use: Never used  Substance and Sexual Activity   Alcohol use: Yes    Comment: every other day, 2-3 beers at a time   Drug use: No   Sexual activity: Yes    Birth control/protection: None  Other Topics Concern   Not on file  Social History Narrative   Not on file   Social Determinants of Health   Financial Resource Strain: Not on file  Food Insecurity:  Not on file  Transportation Needs: Not on file  Physical Activity: Not on file  Stress: Not on file  Social Connections: Not on file  Intimate Partner Violence: Not on file    Objective:   Today's Vitals: BP 122/77   Pulse (!) 118   Temp 98.1 F (36.7 C)   Resp 20   Ht 6' (1.829 m)   Wt 219 lb (99.3 kg)   SpO2 96%   BMI 29.70 kg/m   Physical Exam Vitals reviewed.  Constitutional:      General: He is not in acute distress.    Appearance: Normal appearance. He is overweight. He is not ill-appearing, toxic-appearing or diaphoretic.  HENT:     Head: Normocephalic and atraumatic.  Eyes:     General: No scleral icterus.       Right eye: No discharge.        Left eye: No discharge.     Conjunctiva/sclera: Conjunctivae normal.  Cardiovascular:     Rate and Rhythm: Normal rate and regular rhythm.     Heart sounds: Normal heart sounds. No murmur heard.    No  friction rub. No gallop.  Pulmonary:     Effort: Pulmonary effort is normal. No respiratory distress.     Breath sounds: Normal breath sounds. No stridor. No wheezing, rhonchi or rales.  Musculoskeletal:        General: Normal range of motion.     Cervical back: Normal range of motion.  Skin:    General: Skin is warm and dry.  Neurological:     Mental Status: He is alert and oriented to person, place, and time. Mental status is at baseline.  Psychiatric:        Mood and Affect: Mood normal.        Behavior: Behavior normal.        Thought Content: Thought content normal.        Judgment: Judgment normal.    Diabetic Foot Exam - Simple   Simple Foot Form Diabetic Foot exam was performed with the following findings: Yes 11/05/2021  4:20 PM  Visual Inspection No deformities, no ulcerations, no other skin breakdown bilaterally: Yes Sensation Testing Intact to touch and monofilament testing bilaterally: Yes Pulse Check Posterior Tibialis and Dorsalis pulse intact bilaterally: Yes Comments

## 2021-11-05 NOTE — Patient Instructions (Signed)
Please schedule your eye exam.

## 2021-11-06 ENCOUNTER — Encounter: Payer: Self-pay | Admitting: Family Medicine

## 2021-11-06 LAB — CBC WITH DIFFERENTIAL/PLATELET
Basophils Absolute: 0.1 10*3/uL (ref 0.0–0.2)
Basos: 1 %
EOS (ABSOLUTE): 0.2 10*3/uL (ref 0.0–0.4)
Eos: 3 %
Hematocrit: 39.5 % (ref 37.5–51.0)
Hemoglobin: 13.8 g/dL (ref 13.0–17.7)
Immature Grans (Abs): 0 10*3/uL (ref 0.0–0.1)
Immature Granulocytes: 0 %
Lymphocytes Absolute: 3.5 10*3/uL — ABNORMAL HIGH (ref 0.7–3.1)
Lymphs: 38 %
MCH: 32.2 pg (ref 26.6–33.0)
MCHC: 34.9 g/dL (ref 31.5–35.7)
MCV: 92 fL (ref 79–97)
Monocytes Absolute: 0.7 10*3/uL (ref 0.1–0.9)
Monocytes: 8 %
Neutrophils Absolute: 4.6 10*3/uL (ref 1.4–7.0)
Neutrophils: 50 %
Platelets: 260 10*3/uL (ref 150–450)
RBC: 4.29 x10E6/uL (ref 4.14–5.80)
RDW: 12.5 % (ref 11.6–15.4)
WBC: 9.1 10*3/uL (ref 3.4–10.8)

## 2021-11-06 LAB — MICROALBUMIN / CREATININE URINE RATIO
Creatinine, Urine: 156.4 mg/dL
Microalb/Creat Ratio: 8 mg/g creat (ref 0–29)
Microalbumin, Urine: 12.5 ug/mL

## 2021-11-06 LAB — CMP14+EGFR
ALT: 26 IU/L (ref 0–44)
AST: 17 IU/L (ref 0–40)
Albumin/Globulin Ratio: 2.4 — ABNORMAL HIGH (ref 1.2–2.2)
Albumin: 4.8 g/dL (ref 3.8–4.9)
Alkaline Phosphatase: 56 IU/L (ref 44–121)
BUN/Creatinine Ratio: 12 (ref 9–20)
BUN: 9 mg/dL (ref 6–24)
Bilirubin Total: 0.6 mg/dL (ref 0.0–1.2)
CO2: 23 mmol/L (ref 20–29)
Calcium: 9.8 mg/dL (ref 8.7–10.2)
Chloride: 98 mmol/L (ref 96–106)
Creatinine, Ser: 0.77 mg/dL (ref 0.76–1.27)
Globulin, Total: 2 g/dL (ref 1.5–4.5)
Glucose: 152 mg/dL — ABNORMAL HIGH (ref 70–99)
Potassium: 4.1 mmol/L (ref 3.5–5.2)
Sodium: 134 mmol/L (ref 134–144)
Total Protein: 6.8 g/dL (ref 6.0–8.5)
eGFR: 104 mL/min/{1.73_m2} (ref >59)

## 2021-11-06 LAB — LIPID PANEL
Chol/HDL Ratio: 3.9 ratio (ref 0.0–5.0)
Cholesterol, Total: 109 mg/dL (ref 100–199)
HDL: 28 mg/dL — ABNORMAL LOW (ref >39)
LDL Chol Calc (NIH): 47 mg/dL (ref 0–99)
Triglycerides: 206 mg/dL — ABNORMAL HIGH (ref 0–149)
VLDL Cholesterol Cal: 34 mg/dL (ref 5–40)

## 2021-11-06 LAB — BAYER DCA HB A1C WAIVED: HB A1C (BAYER DCA - WAIVED): 7 % — ABNORMAL HIGH (ref 4.8–5.6)

## 2021-11-07 LAB — SPECIMEN STATUS REPORT

## 2021-11-07 LAB — TSH: TSH: 2.02 u[IU]/mL (ref 0.450–4.500)

## 2021-11-07 LAB — MAGNESIUM: Magnesium: 1.8 mg/dL (ref 1.6–2.3)

## 2021-11-10 ENCOUNTER — Encounter (INDEPENDENT_AMBULATORY_CARE_PROVIDER_SITE_OTHER): Payer: Self-pay | Admitting: *Deleted

## 2021-11-17 ENCOUNTER — Ambulatory Visit: Payer: BC Managed Care – PPO | Admitting: Family

## 2021-11-17 ENCOUNTER — Encounter: Payer: Self-pay | Admitting: Family

## 2021-11-17 VITALS — BP 81/60 | HR 107 | Temp 97.6°F | Ht 72.0 in | Wt 209.0 lb

## 2021-11-17 DIAGNOSIS — K5792 Diverticulitis of intestine, part unspecified, without perforation or abscess without bleeding: Secondary | ICD-10-CM

## 2021-11-17 DIAGNOSIS — I959 Hypotension, unspecified: Secondary | ICD-10-CM

## 2021-11-17 DIAGNOSIS — R109 Unspecified abdominal pain: Secondary | ICD-10-CM

## 2021-11-17 DIAGNOSIS — R1033 Periumbilical pain: Secondary | ICD-10-CM

## 2021-11-17 MED ORDER — CIPROFLOXACIN HCL 500 MG PO TABS: 500.0000 mg | ORAL_TABLET | Freq: Two times a day (BID) | ORAL | 0 refills | Status: DC

## 2021-11-17 MED ORDER — METRONIDAZOLE 500 MG PO TABS: 500.0000 mg | ORAL_TABLET | Freq: Three times a day (TID) | ORAL | 0 refills | Status: AC

## 2021-11-17 NOTE — Progress Notes (Signed)
Subjective:    Patient ID: Ricardo Luna., male    DOB: 04-Dec-1965, 56 y.o.   MRN: [id number]  Chief Complaint  Patient presents with   Diverticulitis    Cipro and fygle    Abdominal Pain This is a new problem. The current episode started in the past 7 days. The onset quality is gradual. The problem occurs intermittently. The problem has been gradually worsening. The pain is located in the LLQ and periumbilical region. The pain is at a severity of 8/10. The pain is moderate. The quality of the pain is cramping and sharp. Associated symptoms include constipation. Pertinent negatives include no belching, diarrhea, fever, flatus, nausea or vomiting. The pain is aggravated by movement. The pain is relieved by Passing flatus. He has tried nothing for the symptoms.      Review of Systems  Constitutional:  Negative for fever.  Gastrointestinal:  Positive for abdominal pain and constipation. Negative for diarrhea, flatus, nausea and vomiting.  All other systems reviewed and are negative.      Objective:   Physical Exam Vitals reviewed.  Constitutional:      General: He is not in acute distress.    Appearance: He is well-developed.  HENT:     Head: Normocephalic.     Right Ear: Tympanic membrane normal.     Left Ear: Tympanic membrane normal.  Eyes:     General:        Right eye: No discharge.        Left eye: No discharge.     Pupils: Pupils are equal, round, and reactive to light.  Neck:     Thyroid: No thyromegaly.  Cardiovascular:     Rate and Rhythm: Normal rate and regular rhythm.     Heart sounds: Normal heart sounds. No murmur heard. Pulmonary:     Effort: Pulmonary effort is normal. No respiratory distress.     Breath sounds: Normal breath sounds. No wheezing.  Abdominal:     General: Bowel sounds are normal. There is no distension.     Palpations: Abdomen is soft.     Tenderness: There is abdominal tenderness.    Musculoskeletal:        General: No  tenderness. Normal range of motion.     Cervical back: Normal range of motion and neck supple.  Skin:    General: Skin is warm and dry.     Findings: No erythema or rash.  Neurological:     Mental Status: He is alert and oriented to person, place, and time.     Cranial Nerves: No cranial nerve deficit.     Deep Tendon Reflexes: Reflexes are normal and symmetric.  Psychiatric:        Behavior: Behavior normal.        Thought Content: Thought content normal.        Judgment: Judgment normal.      . BP (!) 81/60   Pulse (!) 107   Temp 97.6 F (36.4 C) (Temporal)   Ht 6' (1.829 m)   Wt 209 lb (94.8 kg)   SpO2 95%   BMI 28.35 kg/m      Assessment & Plan:  Ricardo Luna. comes in today with chief complaint of Diverticulitis (Cipro and fygle)   Diagnosis and orders addressed:  1. Periumbilical abdominal pain - CMP14+EGFR - CBC with Differential/Platelet  2. Abdominal pain, unspecified abdominal location - CMP14+EGFR - CBC with Differential/Platelet  3. Hypotension, unspecified hypotension type -  CMP14+EGFR - CBC with Differential/Platelet  4. Diverticulitis - ciprofloxacin (CIPRO) 500 MG tablet; Take 1 tablet (500 mg total) by mouth 2 (two) times daily.  Dispense: 14 tablet; Refill: 0 - metroNIDAZOLE (FLAGYL) 500 MG tablet; Take 1 tablet (500 mg total) by mouth 3 (three) times daily for 7 days.  Dispense: 21 tablet; Refill: 0 - CMP14+EGFR - CBC with Differential/Platelet   Labs pending Fluid liquid diet Avoid red dye drinks Start cipro and flagyl  Hold HCTZ and Lisinopril, already took this morning. Will recheck at home. More than likely dehydration.  Follow up in 2 weeks with me     Evelina Dun, FNP

## 2021-11-17 NOTE — Patient Instructions (Signed)
Diverticulitis  Diverticulitis is infection or inflammation of small pouches (diverticula) in the colon that form due to a condition called diverticulosis. Diverticula can trap stool (feces) and bacteria, causing infection and inflammation. Diverticulitis may cause severe stomach pain and diarrhea. It may lead to tissue damage in the colon that causes bleeding or blockage. The diverticula may also burst (rupture) and cause infected stool to enter other areas of the abdomen. What are the causes? This condition is caused by stool becoming trapped in the diverticula, which allows bacteria to grow in the diverticula. This leads to inflammation and infection. What increases the risk? You are more likely to develop this condition if you have diverticulosis. The risk increases if you: Are overweight or obese. Do not get enough exercise. Drink alcohol. Use tobacco products. Eat a diet that has a lot of red meat such as beef, pork, or lamb. Eat a diet that does not include enough fiber. High-fiber foods include fruits, vegetables, beans, nuts, and whole grains. Are over 40 years of age. What are the signs or symptoms? Symptoms of this condition may include: Pain and tenderness in the abdomen. The pain is normally located on the left side of the abdomen, but it may occur in other areas. Fever and chills. Nausea. Vomiting. Cramping. Bloating. Changes in bowel routines. Blood in your stool. How is this diagnosed? This condition is diagnosed based on: Your medical history. A physical exam. Tests to make sure there is nothing else causing your condition. These tests may include: Blood tests. Urine tests. CT scan of the abdomen. How is this treated? Most cases of this condition are mild and can be treated at home. Treatment may include: Taking over-the-counter pain medicines. Following a clear liquid diet. Taking antibiotic medicines by mouth. Resting. More severe cases may need to be treated  at a hospital. Treatment may include: Not eating or drinking. Taking prescription pain medicine. Receiving antibiotic medicines through an IV. Receiving fluids and nutrition through an IV. Surgery. When your condition is under control, your health care provider may recommend that you have a colonoscopy. This is an exam to look at the entire large intestine. During the exam, a lubricated, bendable tube is inserted into the anus and then passed into the rectum, colon, and other parts of the large intestine. A colonoscopy can show how severe your diverticula are and whether something else may be causing your symptoms. Follow these instructions at home: Medicines Take over-the-counter and prescription medicines only as told by your health care provider. These include fiber supplements, probiotics, and stool softeners. If you were prescribed an antibiotic medicine, take it as told by your health care provider. Do not stop taking the antibiotic even if you start to feel better. Ask your health care provider if the medicine prescribed to you requires you to avoid driving or using machinery. Eating and drinking  Follow a full liquid diet or another diet as directed by your health care provider. After your symptoms improve, your health care provider may tell you to change your diet. He or she may recommend that you eat a diet that contains at least 25 grams (25 g) of fiber daily. Fiber makes it easier to pass stool. Healthy sources of fiber include: Berries. One cup contains 4-8 grams of fiber. Beans or lentils. One-half cup contains 5-8 grams of fiber. Green vegetables. One cup contains 4 grams of fiber. Avoid eating red meat. General instructions Do not use any products that contain nicotine or tobacco, such as   cigarettes, e-cigarettes, and chewing tobacco. If you need help quitting, ask your health care provider. Exercise for at least 30 minutes, 3 times each week. You should exercise hard enough to  raise your heart rate and break a sweat. Keep all follow-up visits as told by your health care provider. This is important. You may need to have a colonoscopy. Contact a health care provider if: Your pain does not improve. Your bowel movements do not return to normal. Get help right away if: Your pain gets worse. Your symptoms do not get better with treatment. Your symptoms suddenly get worse. You have a fever. You vomit more than one time. You have stools that are bloody, black, or tarry. Summary Diverticulitis is infection or inflammation of small pouches (diverticula) in the colon that form due to a condition called diverticulosis. Diverticula can trap stool (feces) and bacteria, causing infection and inflammation. You are at higher risk for this condition if you have diverticulosis and you eat a diet that does not include enough fiber. Most cases of this condition are mild and can be treated at home. More severe cases may need to be treated at a hospital. When your condition is under control, your health care provider may recommend that you have an exam called a colonoscopy. This exam can show how severe your diverticula are and whether something else may be causing your symptoms. Keep all follow-up visits as told by your health care provider. This is important. This information is not intended to replace advice given to you by your health care provider. Make sure you discuss any questions you have with your health care provider. Document Revised: 11/14/2018 Document Reviewed: 11/14/2018 Elsevier Patient Education  2023 [employer]  

## 2022-01-12 ENCOUNTER — Other Ambulatory Visit: Payer: Self-pay | Admitting: *Deleted

## 2022-01-12 DIAGNOSIS — K219 Gastro-esophageal reflux disease without esophagitis: Secondary | ICD-10-CM

## 2022-01-12 MED ORDER — OMEPRAZOLE 20 MG PO CPDR: 20.0000 mg | DELAYED_RELEASE_CAPSULE | Freq: Every day | ORAL | 0 refills | Status: DC

## 2022-01-20 ENCOUNTER — Ambulatory Visit: Payer: BC Managed Care – PPO | Admitting: Nurse Practitioner

## 2022-01-20 ENCOUNTER — Encounter: Payer: Self-pay | Admitting: Nurse Practitioner

## 2022-01-20 VITALS — BP 120/65 | HR 100 | Temp 98.7°F | Ht 72.0 in | Wt 217.0 lb

## 2022-01-20 DIAGNOSIS — K5792 Diverticulitis of intestine, part unspecified, without perforation or abscess without bleeding: Secondary | ICD-10-CM

## 2022-01-20 MED ORDER — METRONIDAZOLE 500 MG PO TABS: 500.0000 mg | ORAL_TABLET | Freq: Three times a day (TID) | ORAL | 0 refills | Status: DC

## 2022-01-20 MED ORDER — CIPROFLOXACIN HCL 500 MG PO TABS: 500.0000 mg | ORAL_TABLET | Freq: Two times a day (BID) | ORAL | 0 refills | Status: DC

## 2022-01-20 NOTE — Patient Instructions (Signed)
Diverticulitis  Diverticulitis is when small pouches in your colon (large intestine) get infected or swollen. This causes pain in the belly (abdomen) and watery poop (diarrhea). These pouches are called diverticula. The pouches form in people who have a condition called diverticulosis. What are the causes? This condition may be caused by poop (stool) that gets trapped in the pouches in your colon. The poop lets germs (bacteria) grow in the pouches. This causes the infection. What increases the risk? You are more likely to get this condition if you have small pouches in your colon. The risk is higher if: You are overweight or very overweight (obese). You do not exercise enough. You drink alcohol. You smoke or use products with tobacco in them. You eat a diet that has a lot of red meat such as beef, pork, or lamb. You eat a diet that does not have enough fiber in it. You are older than 58 years of age. What are the signs or symptoms? Pain in the belly. Pain is often on the left side, but it may be in other areas. Fever and feeling cold. Feeling like you may vomit. Vomiting. Having cramps. Feeling full. Changes to how often you poop. Blood in your poop. How is this treated? Most cases are treated at home by: Taking over-the-counter pain medicines. Following a clear liquid diet. Taking antibiotic medicines. Resting. Very bad cases may need to be treated at a hospital. This may include: Not eating or drinking. Taking prescription pain medicine. Getting antibiotic medicines through an IV tube. Getting fluid and food through an IV tube. Having surgery. When you are feeling better, your doctor may tell you to have a test to check your colon (colonoscopy). Follow these instructions at home: Medicines Take over-the-counter and prescription medicines only as told by your doctor. These include: Antibiotics. Pain medicines. Fiber pills. Probiotics. Stool softeners. If you were  prescribed an antibiotic medicine, take it as told by your doctor. Do not stop taking the antibiotic even if you start to feel better. Ask your doctor if the medicine prescribed to you requires you to avoid driving or using machinery. Eating and drinking  Follow a diet as told by your doctor. When you feel better, your doctor may tell you to change your diet. You may need to eat a lot of fiber. Fiber makes it easier to poop (have a bowel movement). Foods with fiber include: Berries. Beans. Lentils. Green vegetables. Avoid eating red meat. General instructions Do not use any products that contain nicotine or tobacco, such as cigarettes, e-cigarettes, and chewing tobacco. If you need help quitting, ask your doctor. Exercise 3 or more times a week. Try to get 30 minutes each time. Exercise enough to sweat and make your heart beat faster. Keep all follow-up visits as told by your doctor. This is important. Contact a doctor if: Your pain does not get better. You are not pooping like normal. Get help right away if: Your pain gets worse. Your symptoms do not get better. Your symptoms get worse very fast. You have a fever. You vomit more than one time. You have poop that is: Bloody. Black. Tarry. Summary This condition happens when small pouches in your colon get infected or swollen. Take medicines only as told by your doctor. Follow a diet as told by your doctor. Keep all follow-up visits as told by your doctor. This is important. This information is not intended to replace advice given to you by your health care provider. Make sure   you discuss any questions you have with your health care provider. Document Revised: 11/14/2018 Document Reviewed: 11/14/2018 Elsevier Patient Education  2023 [employer]  

## 2022-01-20 NOTE — Progress Notes (Signed)
Acute Office Visit  Subjective:     Patient ID: Ricardo Deleo., male    DOB: [birth year], 56 y.o.   MRN: [id number]  Chief Complaint  Patient presents with   Diverticulitis    Pt states he has this come up randomly - this time it is below his belly button     HPI Patient is in today for Diverticulitis: Patient complains of LLQ abdominal pain.  The pain is described as aching, colicky, and cramping, and is 5/10 in intensity. Onset was a few days ago. Symptoms have been unchanged since. Aggravating factors: none.  Alleviating factors: none. Associated symptoms: pain. The patient denies constipation, hematochezia, mucus in the stool, and night sweats.   Review of Systems  Constitutional: Negative.  Negative for chills and fever.  HENT: Negative.    Respiratory: Negative.    Cardiovascular: Negative.   Gastrointestinal:  Positive for abdominal pain. Negative for constipation, diarrhea, nausea and vomiting.  Genitourinary: Negative.   Skin: Negative.  Negative for itching and rash.  All other systems reviewed and are negative.       Objective:    BP 120/65   Pulse 100   Temp 98.7 F (37.1 C)   Ht 6' (1.829 m)   Wt 217 lb (98.4 kg)   SpO2 96%   BMI 29.43 kg/m  BP Readings from Last 3 Encounters:  01/20/22 120/65  11/17/21 (!) 81/60  11/05/21 122/77   Wt Readings from Last 3 Encounters:  01/20/22 217 lb (98.4 kg)  11/17/21 209 lb (94.8 kg)  11/05/21 219 lb (99.3 kg)      Physical Exam Vitals and nursing note reviewed.  Constitutional:      Appearance: Normal appearance.  HENT:     Head: Normocephalic.     Right Ear: External ear normal.     Left Ear: External ear normal.     Nose: Nose normal.  Cardiovascular:     Pulses: Normal pulses.     Heart sounds: Normal heart sounds.  Pulmonary:     Effort: Pulmonary effort is normal.     Breath sounds: Normal breath sounds.  Abdominal:     General: Bowel sounds are normal.     Tenderness: There is abdominal  tenderness. There is no right CVA tenderness or left CVA tenderness.  Musculoskeletal:        General: Normal range of motion.  Skin:    General: Skin is warm.  Neurological:     General: No focal deficit present.     Mental Status: He is alert and oriented to person, place, and time.     No results found for any visits on 01/20/22.      Assessment & Plan:  Patient presents with abdominal pain and after assessment patient is having diverticulitis.  Flare up. Denies fever . Chills or flu like symptoms. Patient has a history of diverticulitis and has not had a flare in about 1 yr. Started patient on metronidazole 500 mg tablet by mouth 3 times daily for 7 days.,  Ciprofloxacin 500 mg tablet by mouth twice daily 7 days, increase hydration, liquid diet recommended.  Follow-up with unresolved symptoms. Problem List Items Addressed This Visit   None Visit Diagnoses     Diverticulitis    -  Primary   Relevant Medications   metroNIDAZOLE (FLAGYL) 500 MG tablet   ciprofloxacin (CIPRO) 500 MG tablet       Meds ordered this encounter  Medications   metroNIDAZOLE (FLAGYL)  500 MG tablet    Sig: Take 1 tablet (500 mg total) by mouth 3 (three) times daily.    Dispense:  21 tablet    Refill:  0    Order Specific Question:   Supervising Provider    Answer:   Jeneen Rinks   ciprofloxacin (CIPRO) 500 MG tablet    Sig: Take 1 tablet (500 mg total) by mouth 2 (two) times daily.    Dispense:  14 tablet    Refill:  0    Order Specific Question:   Supervising Provider    Answer:   Claretta Fraise [phone]    Return if symptoms worsen or fail to improve.  Ricardo Lynn, NP

## 2022-05-16 ENCOUNTER — Other Ambulatory Visit: Payer: Self-pay | Admitting: Family Medicine

## 2022-05-16 DIAGNOSIS — R208 Other disturbances of skin sensation: Secondary | ICD-10-CM

## 2022-05-20 ENCOUNTER — Telehealth: Payer: Self-pay

## 2022-05-20 DIAGNOSIS — R208 Other disturbances of skin sensation: Secondary | ICD-10-CM

## 2022-05-20 MED ORDER — GABAPENTIN 100 MG PO CAPS: 100.0000 mg | ORAL_CAPSULE | Freq: Every day | ORAL | 0 refills | Status: DC | PRN

## 2022-05-20 NOTE — Telephone Encounter (Signed)
Patient needs a refill of hisGabapentin sent to [facility] until appointment on 06/05/22 at 8:20 am with [facility].  Refill send to pharmacy.

## 2022-06-05 ENCOUNTER — Encounter: Payer: Self-pay | Admitting: Family

## 2022-06-05 ENCOUNTER — Ambulatory Visit: Payer: BC Managed Care – PPO | Admitting: Family

## 2022-06-05 VITALS — BP 110/75 | HR 79 | Temp 97.5°F | Ht 72.0 in | Wt 216.0 lb

## 2022-06-05 DIAGNOSIS — Z114 Encounter for screening for human immunodeficiency virus [HIV]: Secondary | ICD-10-CM

## 2022-06-05 DIAGNOSIS — B354 Tinea corporis: Secondary | ICD-10-CM

## 2022-06-05 DIAGNOSIS — Z1211 Encounter for screening for malignant neoplasm of colon: Secondary | ICD-10-CM

## 2022-06-05 DIAGNOSIS — Z1159 Encounter for screening for other viral diseases: Secondary | ICD-10-CM

## 2022-06-05 DIAGNOSIS — E1165 Type 2 diabetes mellitus with hyperglycemia: Secondary | ICD-10-CM

## 2022-06-05 DIAGNOSIS — R208 Other disturbances of skin sensation: Secondary | ICD-10-CM | POA: Diagnosis not present

## 2022-06-05 DIAGNOSIS — Z0001 Encounter for general adult medical examination with abnormal findings: Secondary | ICD-10-CM | POA: Diagnosis not present

## 2022-06-05 DIAGNOSIS — K219 Gastro-esophageal reflux disease without esophagitis: Secondary | ICD-10-CM

## 2022-06-05 DIAGNOSIS — Z0000 Encounter for general adult medical examination without abnormal findings: Secondary | ICD-10-CM

## 2022-06-05 DIAGNOSIS — E1121 Type 2 diabetes mellitus with diabetic nephropathy: Secondary | ICD-10-CM

## 2022-06-05 DIAGNOSIS — E782 Mixed hyperlipidemia: Secondary | ICD-10-CM | POA: Diagnosis not present

## 2022-06-05 DIAGNOSIS — F17200 Nicotine dependence, unspecified, uncomplicated: Secondary | ICD-10-CM

## 2022-06-05 DIAGNOSIS — I10 Essential (primary) hypertension: Secondary | ICD-10-CM

## 2022-06-05 LAB — PSA, TOTAL AND FREE

## 2022-06-05 LAB — LIPID PANEL
Chol/HDL Ratio: 4.3 ratio (ref 0.0–5.0)
HDL: 28 mg/dL — ABNORMAL LOW (ref >39)
LDL Chol Calc (NIH): 59 mg/dL (ref 0–99)

## 2022-06-05 LAB — HIV ANTIBODY (ROUTINE TESTING W REFLEX)

## 2022-06-05 LAB — BAYER DCA HB A1C WAIVED: HB A1C (BAYER DCA - WAIVED): 7.2 % — ABNORMAL HIGH (ref 4.8–5.6)

## 2022-06-05 MED ORDER — JANUMET XR 50-1000 MG PO TB24: 1.0000 | ORAL_TABLET | Freq: Two times a day (BID) | ORAL | 1 refills | Status: DC

## 2022-06-05 MED ORDER — KETOCONAZOLE 2 % EX CREA
1.0000 | TOPICAL_CREAM | Freq: Every day | CUTANEOUS | 1 refills | Status: DC
Start: 2022-06-05 — End: 2022-11-01

## 2022-06-05 MED ORDER — LISINOPRIL 20 MG PO TABS: 20.0000 mg | ORAL_TABLET | Freq: Every day | ORAL | 1 refills | Status: DC

## 2022-06-05 MED ORDER — OMEPRAZOLE 20 MG PO CPDR: 20.0000 mg | DELAYED_RELEASE_CAPSULE | Freq: Every day | ORAL | 0 refills | Status: DC

## 2022-06-05 MED ORDER — HYDROCHLOROTHIAZIDE 50 MG PO TABS: 50.0000 mg | ORAL_TABLET | Freq: Every day | ORAL | 1 refills | Status: DC

## 2022-06-05 MED ORDER — GABAPENTIN 100 MG PO CAPS: 100.0000 mg | ORAL_CAPSULE | Freq: Every day | ORAL | 0 refills | Status: DC | PRN

## 2022-06-05 MED ORDER — ATORVASTATIN CALCIUM 10 MG PO TABS: 10.0000 mg | ORAL_TABLET | Freq: Every day | ORAL | 1 refills | Status: DC

## 2022-06-05 NOTE — Progress Notes (Signed)
Subjective:    Patient ID: Ricardo Luna., male    DOB: Aug 26, 1965, 57 y.o.   MRN: [id number]  Chief Complaint  Patient presents with   Medical Management of Chronic Issues   PT presents to the office today for chronic follow up.  Hypertension This is a chronic problem. The current episode started more than 1 year ago. The problem has been resolved since onset. The problem is controlled. Pertinent negatives include no blurred vision, malaise/fatigue, peripheral edema or shortness of breath. Risk factors for coronary artery disease include dyslipidemia, diabetes mellitus, male gender and smoking/tobacco exposure. The current treatment provides moderate improvement.  Gastroesophageal Reflux He complains of belching and heartburn. This is a chronic problem. The current episode started more than 1 year ago. The problem occurs occasionally. Risk factors include obesity and smoking/tobacco exposure. He has tried a PPI for the symptoms. The treatment provided moderate relief.  Diabetes He presents for his follow-up diabetic visit. He has type 2 diabetes mellitus. Associated symptoms include foot paresthesias. Pertinent negatives for diabetes include no blurred vision. Diabetic complications include peripheral neuropathy. Risk factors for coronary artery disease include diabetes mellitus, dyslipidemia, hypertension, male sex and sedentary lifestyle. He is following a generally unhealthy diet. His overall blood glucose range is 180-200 mg/dl. Eye exam is not current.  Hyperlipidemia This is a chronic problem. The current episode started more than 1 year ago. The problem is controlled. Exacerbating diseases include obesity. Pertinent negatives include no shortness of breath. Current antihyperlipidemic treatment includes statins. The current treatment provides moderate improvement of lipids. Risk factors for coronary artery disease include dyslipidemia, diabetes mellitus, hypertension, male sex and a  sedentary lifestyle.  Nicotine Dependence Presents for follow-up visit. His urge triggers include company of smokers. The symptoms have been stable. He smokes 1 pack of cigarettes per day.  Rash This is a new problem. The current episode started 1 to 4 weeks ago. The problem has been gradually worsening since onset. The affected locations include the left lower leg and right lower leg. Pertinent negatives include no shortness of breath.      Review of Systems  Constitutional:  Negative for malaise/fatigue.  Eyes:  Negative for blurred vision.  Respiratory:  Negative for shortness of breath.   Gastrointestinal:  Positive for heartburn.  Skin:  Positive for rash.  All other systems reviewed and are negative.   Family History  Problem Relation Age of Onset   Hypertension Mother    Diabetes Mother    Stomach cancer Mother    Diabetes Father    Hypertension Father    Thyroid cancer Daughter    Social History   Socioeconomic History   Marital status: Single    Spouse name: Not on file   Number of children: Not on file   Years of education: Not on file   Highest education level: Not on file  Occupational History   Not on file  Tobacco Use   Smoking status: Every Day    Packs/day: 1    Types: Cigarettes   Smokeless tobacco: Never  Vaping Use   Vaping Use: Never used  Substance and Sexual Activity   Alcohol use: Yes    Comment: every other day, 2-3 beers at a time   Drug use: No   Sexual activity: Yes    Birth control/protection: None  Other Topics Concern   Not on file  Social History Narrative   Not on file   Social Determinants of Health  Financial Resource Strain: Not on file  Food Insecurity: Not on file  Transportation Needs: Not on file  Physical Activity: Not on file  Stress: Not on file  Social Connections: Not on file       Objective:   Physical Exam Vitals reviewed.  Constitutional:      General: He is not in acute distress.    Appearance: He  is well-developed.  HENT:     Head: Normocephalic.     Right Ear: Tympanic membrane normal.     Left Ear: Tympanic membrane normal.  Eyes:     General:        Right eye: No discharge.        Left eye: No discharge.     Pupils: Pupils are equal, round, and reactive to light.  Neck:     Thyroid: No thyromegaly.  Cardiovascular:     Rate and Rhythm: Normal rate and regular rhythm.     Heart sounds: Normal heart sounds. No murmur heard. Pulmonary:     Effort: Pulmonary effort is normal. No respiratory distress.     Breath sounds: Normal breath sounds. No wheezing.  Abdominal:     General: Bowel sounds are normal. There is no distension.     Palpations: Abdomen is soft.     Tenderness: There is no abdominal tenderness.  Musculoskeletal:        General: No tenderness. Normal range of motion.     Cervical back: Normal range of motion and neck supple.  Skin:    General: Skin is warm and dry.     Findings: Erythema and rash present.       Neurological:     Mental Status: He is alert and oriented to person, place, and time.     Cranial Nerves: No cranial nerve deficit.     Deep Tendon Reflexes: Reflexes are normal and symmetric.  Psychiatric:        Behavior: Behavior normal.        Thought Content: Thought content normal.        Judgment: Judgment normal.       BP 110/75   Pulse 79   Temp (!) 97.5 F (36.4 C) (Temporal)   Ht 6' (1.829 m)   Wt 216 lb (98 kg)   SpO2 94%   BMI 29.29 kg/m      Assessment & Plan:   Ricardo Luna. comes in today with chief complaint of Medical Management of Chronic Issues   Diagnosis and orders addressed:  1. Mixed hyperlipidemia - atorvastatin (LIPITOR) 10 MG tablet; Take 1 tablet (10 mg total) by mouth daily.  Dispense: 90 tablet; Refill: 1 - CMP14+EGFR - CBC with Differential/Platelet  2. Type 2 diabetes mellitus with hyperglycemia, without long-term current use of insulin - atorvastatin (LIPITOR) 10 MG tablet; Take 1 tablet  (10 mg total) by mouth daily.  Dispense: 90 tablet; Refill: 1 - SitaGLIPtin-MetFORMIN HCl (JANUMET XR) 50-1000 MG TB24; Take 1 tablet by mouth 2 (two) times daily.  Dispense: 180 tablet; Refill: 1 - CMP14+EGFR - CBC with Differential/Platelet  3. Burning sensation of feet - gabapentin (NEURONTIN) 100 MG capsule; Take 1-3 capsules (100-300 mg total) by mouth daily as needed.  Dispense: 90 capsule; Refill: 0 - CMP14+EGFR - CBC with Differential/Platelet  4. Essential hypertension - hydrochlorothiazide (HYDRODIURIL) 50 MG tablet; Take 1 tablet (50 mg total) by mouth daily.  Dispense: 90 tablet; Refill: 1 - lisinopril (ZESTRIL) 20 MG tablet; Take 1 tablet (20 mg  total) by mouth daily.  Dispense: 90 tablet; Refill: 1 - CMP14+EGFR - CBC with Differential/Platelet  5. Gastroesophageal reflux disease, unspecified whether esophagitis present - omeprazole (PRILOSEC) 20 MG capsule; Take 1 capsule (20 mg total) by mouth daily.  Dispense: 90 capsule; Refill: 0 - CMP14+EGFR - CBC with Differential/Platelet  6. Diabetic nephropathy associated with type 2 diabetes mellitus - CMP14+EGFR - CBC with Differential/Platelet  7. Current smoker - CMP14+EGFR - CBC with Differential/Platelet  8. Annual physical exam - CMP14+EGFR - CBC with Differential/Platelet - Bayer DCA Hb A1c Waived - Lipid panel - PSA, total and free - TSH - HIV Antibody (routine testing w rflx) - Hepatitis C antibody  9. Colon cancer screening - Ambulatory referral to Gastroenterology - CMP14+EGFR - CBC with Differential/Platelet  10. Tinea corporis - ketoconazole (NIZORAL) 2 % cream; Apply 1 Application topically daily.  Dispense: 60 g; Refill: 1  11. Encounter for hepatitis C screening test for low risk patient - Hepatitis C antibody  12. Encounter for screening for HIV - HIV Antibody (routine testing w rflx)   Labs pending Health Maintenance reviewed Diet and exercise encouraged  Follow up plan: 6 months     Jannifer Rodney, FNP

## 2022-06-05 NOTE — Patient Instructions (Signed)
Body Ringworm Body ringworm is an infection of the skin that often causes a ring-shaped rash. Body ringworm is also called tinea corporis. Body ringworm can affect any part of your skin. This condition is easily spread from person to person (is very contagious). What are the causes? This condition is caused by fungi called dermatophytes. The condition develops when these fungi grow out of control on the skin. You can get this condition if you touch a person or animal that has it. You can also get it if you share any items with an infected person or pet. These include: Clothing, bedding, and towels. Brushes or combs. Gym equipment. Any other object that has the fungus on it. What increases the risk? You are more likely to develop this condition if you: Play sports that involve close physical contact, such as wrestling. Sweat a lot. Live in areas that are hot and humid. Use public showers. Have a weakened disease-fighting system (immune system). What are the signs or symptoms? Symptoms of this condition include: Itchy, raised red spots and bumps. Red scaly patches. A ring-shaped rash. The rash may have: A clear center. Scales or red bumps at its center. Redness near its borders. Dry and scaly skin on or around it. How is this diagnosed? This condition can usually be diagnosed with a skin exam. A skin scraping may be taken from the affected area and examined under a microscope to see if the fungus is present. How is this treated? This condition may be treated with: An antifungal cream or ointment. An antifungal shampoo. Antifungal medicines. These may be prescribed if your ringworm: Is severe. Keeps coming back or lasts a long time. Follow these instructions at home: Take over-the-counter and prescription medicines only as told by your health care provider. If you were given an antifungal cream or ointment: Use it as told by your health care provider. Wash the infected area and  dry it completely before applying the cream or ointment. If you were given an antifungal shampoo: Use it as told by your health care provider. Leave the shampoo on your body for 3-5 minutes before rinsing. While you have a rash: Wear loose clothing to stop clothes from rubbing and irritating it. Wash or change your bed sheets every night. Wash clothes and bed sheets in hot water. Disinfect or throw out items that may be infected. Wash your hands often with soap and water for at least 20 seconds. If soap and water are not available, use hand sanitizer. If your pet has the same infection, take your pet to see a veterinarian for treatment. How is this prevented? Take a bath or shower every day and after every time you work out or play sports. Dry your skin completely after bathing. Wear sandals or shoes in public places and showers. Wash athletic clothes after each use. Do not share personal items with others. Avoid touching red patches of skin on other people. Avoid touching pets that have bald spots. If you touch an animal that has a bald spot, wash your hands. Contact a health care provider if: Your rash continues to spread after 7 days of treatment. Your rash is not gone in 4 weeks. The area around your rash gets red, warm, tender, and swollen. This information is not intended to replace advice given to you by your health care provider. Make sure you discuss any questions you have with your health care provider. Document Revised: 07/17/2021 Document Reviewed: 07/17/2021 Elsevier Patient Education  2023 Else[employer] Inc[employer]  

## 2022-06-06 LAB — CBC WITH DIFFERENTIAL/PLATELET
Basophils Absolute: 0 10*3/uL (ref 0.0–0.2)
Basos: 1 %
EOS (ABSOLUTE): 0.4 10*3/uL (ref 0.0–0.4)
Eos: 5 %
Hematocrit: 43.2 % (ref 37.5–51.0)
Hemoglobin: 15 g/dL (ref 13.0–17.7)
Immature Grans (Abs): 0 10*3/uL (ref 0.0–0.1)
Immature Granulocytes: 0 %
Lymphocytes Absolute: 2.4 10*3/uL (ref 0.7–3.1)
Lymphs: 32 %
MCH: 30.9 pg (ref 26.6–33.0)
MCHC: 34.7 g/dL (ref 31.5–35.7)
MCV: 89 fL (ref 79–97)
Monocytes Absolute: 0.5 10*3/uL (ref 0.1–0.9)
Monocytes: 7 %
Neutrophils Absolute: 4.2 10*3/uL (ref 1.4–7.0)
Neutrophils: 55 %
Platelets: 324 10*3/uL (ref 150–450)
RBC: 4.86 x10E6/uL (ref 4.14–5.80)
RDW: 12.1 % (ref 11.6–15.4)
WBC: 7.5 10*3/uL (ref 3.4–10.8)

## 2022-06-06 LAB — CMP14+EGFR
ALT: 31 [IU]/L (ref 0–44)
AST: 23 [IU]/L (ref 0–40)
Albumin/Globulin Ratio: 1.9 (ref 1.2–2.2)
Albumin: 4.8 g/dL (ref 3.8–4.9)
Alkaline Phosphatase: 71 [IU]/L (ref 44–121)
BUN/Creatinine Ratio: 17 (ref 9–20)
BUN: 13 mg/dL (ref 6–24)
Bilirubin Total: 0.6 mg/dL (ref 0.0–1.2)
CO2: 23 mmol/L (ref 20–29)
Calcium: 10.4 mg/dL — ABNORMAL HIGH (ref 8.7–10.2)
Chloride: 96 mmol/L (ref 96–106)
Creatinine, Ser: 0.77 mg/dL (ref 0.76–1.27)
Globulin, Total: 2.5 g/dL (ref 1.5–4.5)
Glucose: 152 mg/dL — ABNORMAL HIGH (ref 70–99)
Potassium: 4.2 mmol/L (ref 3.5–5.2)
Sodium: 134 mmol/L (ref 134–144)
Total Protein: 7.3 g/dL (ref 6.0–8.5)
eGFR: 104 mL/min/{1.73_m2}

## 2022-06-06 LAB — LIPID PANEL
Cholesterol, Total: 121 mg/dL (ref 100–199)
Triglycerides: 206 mg/dL — ABNORMAL HIGH (ref 0–149)
VLDL Cholesterol Cal: 34 mg/dL (ref 5–40)

## 2022-06-06 LAB — PSA, TOTAL AND FREE: PSA, Free: 0.15 ng/mL

## 2022-06-06 LAB — HEPATITIS C ANTIBODY: Hep C Virus Ab: NONREACTIVE

## 2022-06-06 LAB — TSH: TSH: 1.95 u[IU]/mL (ref 0.450–4.500)

## 2022-06-09 ENCOUNTER — Encounter (INDEPENDENT_AMBULATORY_CARE_PROVIDER_SITE_OTHER): Payer: Self-pay | Admitting: *Deleted

## 2022-10-16 ENCOUNTER — Ambulatory Visit: Payer: BC Managed Care – PPO | Admitting: Family

## 2022-10-16 ENCOUNTER — Encounter: Payer: Self-pay | Admitting: Family

## 2022-10-16 VITALS — BP 127/83 | HR 96 | Temp 97.8°F | Ht 72.0 in | Wt 213.8 lb

## 2022-10-16 DIAGNOSIS — R1032 Left lower quadrant pain: Secondary | ICD-10-CM

## 2022-10-16 DIAGNOSIS — K5792 Diverticulitis of intestine, part unspecified, without perforation or abscess without bleeding: Secondary | ICD-10-CM | POA: Diagnosis not present

## 2022-10-16 LAB — CBC WITH DIFFERENTIAL/PLATELET
Basophils Absolute: 0 10*3/uL (ref 0.0–0.2)
Basos: 1 %
EOS (ABSOLUTE): 0.2 10*3/uL (ref 0.0–0.4)
Eos: 3 %
Hematocrit: 43.6 % (ref 37.5–51.0)
Hemoglobin: 15.1 g/dL (ref 13.0–17.7)
Immature Grans (Abs): 0 10*3/uL (ref 0.0–0.1)
Immature Granulocytes: 0 %
Lymphocytes Absolute: 2.9 10*3/uL (ref 0.7–3.1)
Lymphs: 35 %
MCH: 31.6 pg (ref 26.6–33.0)
MCHC: 34.6 g/dL (ref 31.5–35.7)
MCV: 91 fL (ref 79–97)
Monocytes Absolute: 0.7 10*3/uL (ref 0.1–0.9)
Monocytes: 8 %
Neutrophils Absolute: 4.4 10*3/uL (ref 1.4–7.0)
Neutrophils: 53 %
Platelets: 275 10*3/uL (ref 150–450)
RBC: 4.78 x10E6/uL (ref 4.14–5.80)
RDW: 11.9 % (ref 11.6–15.4)
WBC: 8.3 10*3/uL (ref 3.4–10.8)

## 2022-10-16 LAB — CMP14+EGFR
ALT: 31 IU/L (ref 0–44)
AST: 24 IU/L (ref 0–40)
Albumin: 4.9 g/dL (ref 3.8–4.9)
Alkaline Phosphatase: 68 IU/L (ref 44–121)
BUN/Creatinine Ratio: 14 (ref 9–20)
BUN: 10 mg/dL (ref 6–24)
Bilirubin Total: 0.9 mg/dL (ref 0.0–1.2)
CO2: 22 mmol/L (ref 20–29)
Calcium: 10 mg/dL (ref 8.7–10.2)
Chloride: 94 mmol/L — ABNORMAL LOW (ref 96–106)
Creatinine, Ser: 0.74 mg/dL — ABNORMAL LOW (ref 0.76–1.27)
Globulin, Total: 2.6 g/dL (ref 1.5–4.5)
Glucose: 155 mg/dL — ABNORMAL HIGH (ref 70–99)
Potassium: 4.4 mmol/L (ref 3.5–5.2)
Sodium: 132 mmol/L — ABNORMAL LOW (ref 134–144)
Total Protein: 7.5 g/dL (ref 6.0–8.5)
eGFR: 105 mL/min/{1.73_m2} (ref >59)

## 2022-10-16 MED ORDER — CIPROFLOXACIN HCL 500 MG PO TABS: 500.0000 mg | ORAL_TABLET | Freq: Two times a day (BID) | ORAL | 0 refills | Status: DC

## 2022-10-16 MED ORDER — METRONIDAZOLE 500 MG PO TABS
500.0000 mg | ORAL_TABLET | Freq: Three times a day (TID) | ORAL | 0 refills | Status: DC
Start: 2022-10-16 — End: 2022-10-30

## 2022-10-16 NOTE — Progress Notes (Signed)
Subjective:    Patient ID: Ricardo Luna., male    DOB: [birth year], 57 y.o.   MRN: [id number]  Chief Complaint  Patient presents with   Abdominal Pain   PT presents to the office today with LLQ pain that started a few days ago. He took some magnesium citrate to clear himself out. Reports his pain is improved, but still has slightly aching pain of 5 out 10.  Has hx of diverticulitis.   Abdominal Pain This is a recurrent problem. The current episode started in the past 7 days. The onset quality is sudden. The problem occurs intermittently. The problem has been gradually worsening. The pain is located in the LLQ. The pain is at a severity of 5/10. The pain is moderate. The quality of the pain is cramping and aching. Associated symptoms include constipation. Pertinent negatives include no belching, diarrhea, dysuria, fever, flatus, headaches, nausea or vomiting. The pain is aggravated by eating. The pain is relieved by Liquids. The treatment provided mild relief.      Review of Systems  Constitutional:  Negative for fever.  Gastrointestinal:  Positive for abdominal pain and constipation. Negative for diarrhea, flatus, nausea and vomiting.  Genitourinary:  Negative for dysuria.  Neurological:  Negative for headaches.  All other systems reviewed and are negative.      Objective:   Physical Exam Vitals reviewed.  Constitutional:      General: He is not in acute distress.    Appearance: He is well-developed.  HENT:     Head: Normocephalic.     Right Ear: External ear normal.     Left Ear: External ear normal.  Eyes:     General:        Right eye: No discharge.        Left eye: No discharge.     Pupils: Pupils are equal, round, and reactive to light.  Neck:     Thyroid: No thyromegaly.  Cardiovascular:     Rate and Rhythm: Normal rate and regular rhythm.     Heart sounds: Normal heart sounds. No murmur heard. Pulmonary:     Effort: Pulmonary effort is normal. No respiratory  distress.     Breath sounds: Normal breath sounds. No wheezing.  Abdominal:     General: Bowel sounds are normal. There is no distension.     Palpations: Abdomen is soft.     Tenderness: There is abdominal tenderness (mild) in the left lower quadrant.  Musculoskeletal:        General: No tenderness. Normal range of motion.     Cervical back: Normal range of motion and neck supple.  Skin:    General: Skin is warm and dry.     Findings: No erythema or rash.  Neurological:     Mental Status: He is alert and oriented to person, place, and time.     Cranial Nerves: No cranial nerve deficit.     Deep Tendon Reflexes: Reflexes are normal and symmetric.  Psychiatric:        Behavior: Behavior normal.        Thought Content: Thought content normal.        Judgment: Judgment normal.      BP 127/83   Pulse 96   Temp 97.8 F (36.6 C) (Temporal)   Ht 6' (1.829 m)   Wt 213 lb 12.8 oz (97 kg)   SpO2 95%   BMI 29.00 kg/m       Assessment & Plan:  Ricardo Luna. comes in today with chief complaint of Abdominal Pain   Diagnosis and orders addressed:  1. Left lower quadrant abdominal pain - Ambulatory referral to Gastroenterology - CBC with Differential/Platelet - CMP14+EGFR  2. Diverticulitis - Ambulatory referral to Gastroenterology - CBC with Differential/Platelet - CMP14+EGFR - ciprofloxacin (CIPRO) 500 MG tablet; Take 1 tablet (500 mg total) by mouth 2 (two) times daily.  Dispense: 14 tablet; Refill: 0 - metroNIDAZOLE (FLAGYL) 500 MG tablet; Take 1 tablet (500 mg total) by mouth 3 (three) times daily.  Dispense: 21 tablet; Refill: 0   Force fluids Liquid diet Start cipro and flagyl  Red flags discussed to go to ED GI referral pending  Keep chronic  follow up   Jannifer Rodney, FNP

## 2022-10-16 NOTE — Patient Instructions (Signed)
Diverticulitis  Diverticulitis happens when poop (stool) and bacteria get trapped in small pouches in the colon called diverticula. These pouches may form if you have a condition called diverticulosis. When the poop and bacteria get trapped, it can cause an infection and inflammation. Diverticulitis may cause severe stomach pain and diarrhea. It can also lead to tissue damage in your colon. This can cause bleeding or blockage. In some cases, the diverticula may burst (rupture). This can cause infected poop to go into other parts of your abdomen. What are the causes? This condition is caused by poop getting trapped in the diverticula. This allows bacteria to grow. It can lead to inflammation and infection. What increases the risk? You are more likely to get this condition if you have diverticulosis. You are also more at risk if: You are overweight or obese. You do not get enough exercise. You drink alcohol. You smoke. You eat a lot of red meat, such as beef, pork, or lamb. You do not get enough fiber. Foods high in fiber include fruits, vegetables, beans, nuts, and whole grains. You are over 35 years of age. What are the signs or symptoms? Symptoms of this condition may include: Pain and tenderness in the abdomen. This pain is often felt on the left side but may occur in other spots. Fever and chills. Nausea and vomiting. Cramping. Bloating. Changes in how often you poop. Blood in your poop. How is this diagnosed? This condition is diagnosed based on your medical history and a physical exam. You may also have tests done to make sure there is nothing else causing your condition. These tests may include: Blood tests. Tests done on your pee (urine). A CT scan of the abdomen. You may need to have a colonoscopy. This is an exam to look at your whole large intestine. During the exam, a tube is put into the opening of your butt (anus) and then moved into your rectum, colon, and other parts of  the large intestine. This exam is done to look at the diverticula. It can also see if there is something else that may be causing your symptoms. How is this treated? Most cases are mild and can be treated at home. You may be told to: Take over-the-counter pain medicine. Only eat and drink clear liquids. Take antibiotics. Rest. More severe cases may need to be treated at a hospital. Treatment may include: Not eating or drinking. Taking pain medicines. Getting antibiotics through an IV. Getting fluids and nutrition through an IV. Surgery. Follow these instructions at home: Medicines Take over-the-counter and prescription medicines only as told by your health care provider. These include fiber supplements, probiotics, and medicines to soften your poop (stool softeners). If you were prescribed antibiotics, take them as told by your provider. Do not stop using the antibiotic even if you start to feel better. Ask your provider if the medicine prescribed to you requires you to avoid driving or using machinery. Eating and drinking  Follow the diet told by your provider. You may need to only eat and drink liquids. After your symptoms get better, you may be able to return to a more normal diet. You may be told to eat at least 25 grams (25 g) of fiber each day. Fiber makes it easier to poop. Healthy sources of fiber include: Berries. One cup has 4-8 g of fiber. Beans or lentils. One-half cup has 5-8 g of fiber. Green vegetables. One cup has 4 g of fiber. Avoid eating red meat.  General instructions Do not use any products that contain nicotine or tobacco. These products include cigarettes, chewing tobacco, and vaping devices, such as e-cigarettes. If you need help quitting, ask your provider. Exercise for at least 30 minutes, 3 times a week. Exercise hard enough to raise your heart rate and break a sweat. Contact a health care provider if: Your pain gets worse. Your pooping does not go back to  normal. Your symptoms do not get better with treatment. Your symptoms get worse all of a sudden. You have a fever. You vomit more than one time. Your poop is bloody, black, or tarry. This information is not intended to replace advice given to you by your health care provider. Make sure you discuss any questions you have with your health care provider. Document Revised: 10/30/2021 Document Reviewed: 10/30/2021 Elsevier Patient Education  2024 [employer].

## 2022-10-22 ENCOUNTER — Encounter (INDEPENDENT_AMBULATORY_CARE_PROVIDER_SITE_OTHER): Payer: Self-pay | Admitting: *Deleted

## 2022-10-23 ENCOUNTER — Telehealth: Payer: Self-pay | Admitting: Family

## 2022-10-23 NOTE — Telephone Encounter (Signed)
Left message for pt to call back to schedule a Diabetic Eye Exam if he wants one done here at [facility].

## 2022-10-28 ENCOUNTER — Ambulatory Visit (INDEPENDENT_AMBULATORY_CARE_PROVIDER_SITE_OTHER): Payer: BC Managed Care – PPO | Admitting: Gastroenterology

## 2022-10-30 ENCOUNTER — Encounter (HOSPITAL_COMMUNITY): Payer: Self-pay

## 2022-10-30 ENCOUNTER — Other Ambulatory Visit: Payer: Self-pay

## 2022-10-30 ENCOUNTER — Emergency Department (HOSPITAL_COMMUNITY): Payer: BC Managed Care – PPO

## 2022-10-30 ENCOUNTER — Inpatient Hospital Stay (HOSPITAL_COMMUNITY)
Admission: EM | Admit: 2022-10-30 | Discharge: 2022-11-01 | DRG: 392 | Disposition: A | Discharge status: Home / Self Care | Payer: BC Managed Care – PPO | Attending: Internal Medicine | Admitting: Internal Medicine

## 2022-10-30 DIAGNOSIS — Z833 Family history of diabetes mellitus: Secondary | ICD-10-CM | POA: Diagnosis not present

## 2022-10-30 DIAGNOSIS — Z7982 Long term (current) use of aspirin: Secondary | ICD-10-CM | POA: Diagnosis not present

## 2022-10-30 DIAGNOSIS — K5732 Diverticulitis of large intestine without perforation or abscess without bleeding: Secondary | ICD-10-CM | POA: Diagnosis present

## 2022-10-30 DIAGNOSIS — K219 Gastro-esophageal reflux disease without esophagitis: Secondary | ICD-10-CM | POA: Diagnosis present

## 2022-10-30 DIAGNOSIS — E871 Hypo-osmolality and hyponatremia: Secondary | ICD-10-CM | POA: Diagnosis present

## 2022-10-30 DIAGNOSIS — Z8249 Family history of ischemic heart disease and other diseases of the circulatory system: Secondary | ICD-10-CM | POA: Diagnosis not present

## 2022-10-30 DIAGNOSIS — I10 Essential (primary) hypertension: Secondary | ICD-10-CM | POA: Diagnosis present

## 2022-10-30 DIAGNOSIS — Z800 Family history of malignant neoplasm of digestive organs: Secondary | ICD-10-CM

## 2022-10-30 DIAGNOSIS — Z79899 Other long term (current) drug therapy: Secondary | ICD-10-CM | POA: Diagnosis not present

## 2022-10-30 DIAGNOSIS — K5792 Diverticulitis of intestine, part unspecified, without perforation or abscess without bleeding: Principal | ICD-10-CM | POA: Diagnosis present

## 2022-10-30 DIAGNOSIS — Z880 Allergy status to penicillin: Secondary | ICD-10-CM

## 2022-10-30 DIAGNOSIS — E1165 Type 2 diabetes mellitus with hyperglycemia: Secondary | ICD-10-CM | POA: Diagnosis present

## 2022-10-30 DIAGNOSIS — Z808 Family history of malignant neoplasm of other organs or systems: Secondary | ICD-10-CM | POA: Diagnosis not present

## 2022-10-30 DIAGNOSIS — Z0182 Encounter for allergy testing: Secondary | ICD-10-CM

## 2022-10-30 DIAGNOSIS — F17210 Nicotine dependence, cigarettes, uncomplicated: Secondary | ICD-10-CM | POA: Diagnosis present

## 2022-10-30 DIAGNOSIS — E782 Mixed hyperlipidemia: Secondary | ICD-10-CM | POA: Diagnosis present

## 2022-10-30 DIAGNOSIS — Z7984 Long term (current) use of oral hypoglycemic drugs: Secondary | ICD-10-CM | POA: Diagnosis not present

## 2022-10-30 LAB — URINALYSIS, ROUTINE W REFLEX MICROSCOPIC
Bilirubin Urine: NEGATIVE
Glucose, UA: 150 mg/dL — AB
Hgb urine dipstick: NEGATIVE
Ketones, ur: NEGATIVE mg/dL
Leukocytes,Ua: NEGATIVE
Nitrite: NEGATIVE
Protein, ur: NEGATIVE mg/dL
Specific Gravity, Urine: 1.011 (ref 1.005–1.030)
pH: 7 (ref 5.0–8.0)

## 2022-10-30 LAB — CBC
HCT: 38.6 % — ABNORMAL LOW (ref 39.0–52.0)
Hemoglobin: 13.7 g/dL (ref 13.0–17.0)
MCH: 31 pg (ref 26.0–34.0)
MCHC: 35.5 g/dL (ref 30.0–36.0)
MCV: 87.3 fL (ref 80.0–100.0)
Platelets: 335 10*3/uL (ref 150–400)
RBC: 4.42 MIL/uL (ref 4.22–5.81)
RDW: 11.9 % (ref 11.5–15.5)
WBC: 9.3 10*3/uL (ref 4.0–10.5)
nRBC: 0 % (ref 0.0–0.2)

## 2022-10-30 LAB — COMPREHENSIVE METABOLIC PANEL
ALT: 28 U/L (ref 0–44)
AST: 20 U/L (ref 15–41)
Albumin: 4.2 g/dL (ref 3.5–5.0)
Alkaline Phosphatase: 51 U/L (ref 38–126)
Anion gap: 10 (ref 5–15)
BUN: 8 mg/dL (ref 6–20)
CO2: 27 mmol/L (ref 22–32)
Calcium: 9.4 mg/dL (ref 8.9–10.3)
Chloride: 93 mmol/L — ABNORMAL LOW (ref 98–111)
Creatinine, Ser: 0.68 mg/dL (ref 0.61–1.24)
GFR, Estimated: >60 mL/min (ref >60)
Glucose, Bld: 244 mg/dL — ABNORMAL HIGH (ref 70–99)
Potassium: 4 mmol/L (ref 3.5–5.1)
Sodium: 130 mmol/L — ABNORMAL LOW (ref 135–145)
Total Bilirubin: 0.5 mg/dL (ref 0.3–1.2)
Total Protein: 7.5 g/dL (ref 6.5–8.1)

## 2022-10-30 LAB — GLUCOSE, CAPILLARY: Glucose-Capillary: 156 mg/dL — ABNORMAL HIGH (ref 70–99)

## 2022-10-30 LAB — LIPASE, BLOOD: Lipase: 24 U/L (ref 11–51)

## 2022-10-30 MED ORDER — METRONIDAZOLE 500 MG/100ML IV SOLN: 500.0000 mg | Freq: Three times a day (TID) | INTRAVENOUS | Status: DC

## 2022-10-30 MED ORDER — SODIUM CHLORIDE 0.9 % IV BOLUS
1000.0000 mL | Freq: Once | INTRAVENOUS | Status: AC
  Administered 2022-10-30: 1000 mL via INTRAVENOUS

## 2022-10-30 MED ORDER — ONDANSETRON HCL 4 MG/2ML IJ SOLN
4.0000 mg | Freq: Four times a day (QID) | INTRAMUSCULAR | Status: DC | PRN
  Administered 2022-10-30: 4 mg via INTRAVENOUS
  Filled 2022-10-30: qty 2

## 2022-10-30 MED ORDER — OXYCODONE HCL 5 MG PO TABS
5.0000 mg | ORAL_TABLET | ORAL | Status: DC | PRN
  Administered 2022-10-30 – 2022-10-31 (×3): 5 mg via ORAL
  Filled 2022-10-30 (×4): qty 1

## 2022-10-30 MED ORDER — CIPROFLOXACIN IN D5W 400 MG/200ML IV SOLN
400.0000 mg | Freq: Once | INTRAVENOUS | Status: AC
  Administered 2022-10-30: 400 mg via INTRAVENOUS
  Filled 2022-10-30: qty 200

## 2022-10-30 MED ORDER — CIPROFLOXACIN IN D5W 400 MG/200ML IV SOLN
400.0000 mg | Freq: Two times a day (BID) | INTRAVENOUS | Status: DC
  Administered 2022-10-31: 400 mg via INTRAVENOUS
  Filled 2022-10-30: qty 200

## 2022-10-30 MED ORDER — KETOROLAC TROMETHAMINE 30 MG/ML IJ SOLN
30.0000 mg | Freq: Once | INTRAMUSCULAR | Status: AC
  Administered 2022-10-30: 30 mg via INTRAVENOUS
  Filled 2022-10-30: qty 1

## 2022-10-30 MED ORDER — ACETAMINOPHEN 650 MG RE SUPP: 650.0000 mg | Freq: Four times a day (QID) | RECTAL | Status: DC | PRN

## 2022-10-30 MED ORDER — MORPHINE SULFATE (PF) 2 MG/ML IV SOLN
2.0000 mg | INTRAVENOUS | Status: DC | PRN
  Administered 2022-10-30 – 2022-10-31 (×3): 2 mg via INTRAVENOUS
  Filled 2022-10-30 (×3): qty 1

## 2022-10-30 MED ORDER — IOHEXOL 300 MG/ML  SOLN
100.0000 mL | Freq: Once | INTRAMUSCULAR | Status: AC | PRN
  Administered 2022-10-30: 100 mL via INTRAVENOUS

## 2022-10-30 MED ORDER — METRONIDAZOLE 500 MG/100ML IV SOLN
500.0000 mg | Freq: Once | INTRAVENOUS | Status: AC
  Administered 2022-10-30: 500 mg via INTRAVENOUS
  Filled 2022-10-30: qty 100

## 2022-10-30 MED ORDER — ENOXAPARIN SODIUM 40 MG/0.4ML IJ SOSY
40.0000 mg | PREFILLED_SYRINGE | INTRAMUSCULAR | Status: DC
  Administered 2022-10-30 – 2022-10-31 (×2): 40 mg via SUBCUTANEOUS
  Filled 2022-10-30 (×2): qty 0.4

## 2022-10-30 MED ORDER — METRONIDAZOLE 500 MG/100ML IV SOLN
500.0000 mg | Freq: Two times a day (BID) | INTRAVENOUS | Status: DC
  Administered 2022-10-31 – 2022-11-01 (×3): 500 mg via INTRAVENOUS
  Filled 2022-10-30 (×3): qty 100

## 2022-10-30 MED ORDER — ONDANSETRON HCL 4 MG PO TABS: 4.0000 mg | ORAL_TABLET | Freq: Four times a day (QID) | ORAL | Status: DC | PRN

## 2022-10-30 MED ORDER — LACTATED RINGERS IV SOLN: INTRAVENOUS | Status: DC

## 2022-10-30 MED ORDER — ACETAMINOPHEN 325 MG PO TABS: 650.0000 mg | ORAL_TABLET | Freq: Four times a day (QID) | ORAL | Status: DC | PRN

## 2022-10-30 MED ORDER — INSULIN ASPART 100 UNIT/ML IJ SOLN
0.0000 [IU] | Freq: Four times a day (QID) | INTRAMUSCULAR | Status: DC
  Administered 2022-10-31: 1 [IU] via SUBCUTANEOUS
  Administered 2022-10-31 (×2): 2 [IU] via SUBCUTANEOUS
  Filled 2022-10-30: qty 0.09

## 2022-10-30 NOTE — H&P (Signed)
History and Physical    Ricardo Luna. QMV:[id number] DOB: May 07, 1965 DOA: 10/30/2022  PCP: Junie Spencer, FNP   Patient coming from: Home  Chief Complaint  Patient presents with   Abdominal Pain      HPI: 57 year old male with hyperlipidemia, hypertension, diabetes on metformin/Januvia who recently had diverticulitis and taking oral antibiotics which she finished 2 days PTA presented to the ED with 2 days of significant right lower quadrant abdominal pain, some nausea and also vomiting today. Patient otherwise denies chest pain, shortness of breath, fever, chills, headache, focal weakness, numbness tingling, speech difficulties   In the ED: Vitals initial tachycardia 116 subsequently, BP stable, saturating well on room air.  Labs showed hyponatremia at 130 previously 132 on August 13, blood glucose 244, normal CBC, lipase and LFTs.  UA nitrate and LE negative. CT abdomen pelvis with contrast>> acute diverticulitis with developing intramural abscess of the sigmoid colon, no drainable fluid collection, peritoneal thickening and enhancement, 1.1 cm peripancreatic lymph node, aortic atherosclerosis.  Patient was given Cipro Flagyl NS 1 L bolus and admission requested for further management   Assessment/Plan Principal Problem:   Diverticulitis  Acute diverticulitis with developing intramural abscess of the sigmoid colon Recurrent episode of diverticulitis, multiple in apst 20 yrs, last one finished antibiotics 2 days PTA: Fortunately no evidence of sepsis no leukocytosis or fever.  Will keep on IV antibiotics Cipro Flagyl for now he does have listed penicillin allergy- listed as high.  Started on Cipro + Flagyl in ED, continue the same.reports he had improved on same antibiotics on multiple occasions in the past.  ID tomorrow if we need to change to antibiotics, has penicillin allergy listed as high at childhood.  Although recently took antibiotics for boil and wife will bring it  tomorrow. His last colonoscopy was at [facility] 3 yrs ago w/ one poly and non acute.  Keep n.p.o., on IV fluids, pain control ice chips.  Will need to consult surgery in the morning due to recurrent episodes.  Hyponatremia: Cont ivf.  Hypertension: BP stable, hold meds due to #1  Type II diabetes mellitus: Hold OHA, add sliding scale insulin, update HbA1c..  HLD: Holding statin for now while NPO.  GERD: Continue PPI  Body mass index is 29.16 kg/m.   Severity of Illness: The appropriate patient status for this patient is INPATIENT. Inpatient status is judged to be reasonable and necessary in order to provide the required intensity of service to ensure the patient's safety. The patient's presenting symptoms, physical exam findings, and initial radiographic and laboratory data in the context of their chronic comorbidities is felt to place them at high risk for further clinical deterioration. Furthermore, it is not anticipated that the patient will be medically stable for discharge from the hospital within 2 midnights of admission.   * I certify that at the point of admission it is my clinical judgment that the patient will require inpatient hospital care spanning beyond 2 midnights from the point of admission due to high intensity of service, high risk for further deterioration and high frequency of surveillance required.*   DVT prophylaxis: enoxaparin (LOVENOX) injection 40 mg Start: 10/30/22 1845  Code Status:   Code Status: Full Code  Family Communication: Admission, patients condition and plan of care including tests being ordered have been discussed with the patient and  his wife  who indicate understanding and agree with the plan and Code Status.  Consults called:  None   Review of Systems:  All systems were reviewed and were negative except as mentioned in HPI above. Negative for fever Negative for chest pain Negative for shortness of breath  Past Medical History:  Diagnosis  Date   Diverticulitis    Hypercholesteremia    Hypertension    Type 2 diabetes mellitus (HCC)     History reviewed. No pertinent surgical history.   reports that he has been smoking cigarettes. He has never used smokeless tobacco. He reports current alcohol use. He reports that he does not use drugs.  Allergies  Allergen Reactions   Penicillins     Family History  Problem Relation Age of Onset   Hypertension Mother    Diabetes Mother    Stomach cancer Mother    Diabetes Father    Hypertension Father    Thyroid cancer Daughter      Prior to Admission medications   Medication Sig Start Date End Date Taking? Authorizing Provider  aspirin EC 81 MG tablet Take 81 mg by mouth daily. Swallow whole.   Yes [provider]  atorvastatin (LIPITOR) 10 MG tablet Take 1 tablet (10 mg total) by mouth daily. 06/05/22  Yes Hawks, Christy A, FNP  gabapentin (NEURONTIN) 100 MG capsule Take 1-3 capsules (100-300 mg total) by mouth daily as needed. 06/05/22  Yes Hawks, Christy A, FNP  hydrochlorothiazide (HYDRODIURIL) 50 MG tablet Take 1 tablet (50 mg total) by mouth daily. 06/05/22  Yes Hawks, Christy A, FNP  lisinopril (ZESTRIL) 20 MG tablet Take 1 tablet (20 mg total) by mouth daily. 06/05/22  Yes Hawks, Christy A, FNP  omeprazole (PRILOSEC) 20 MG capsule Take 1 capsule (20 mg total) by mouth daily. 06/05/22  Yes Hawks, Christy A, FNP  SitaGLIPtin-MetFORMIN HCl (JANUMET XR) 50-1000 MG TB24 Take 1 tablet by mouth 2 (two) times daily. 06/05/22  Yes Hawks, Christy A, FNP  ACCU-CHEK GUIDE test strip USE 1 STRIP TO CHECK GLUCOSE UP TO 4 TIMES DAILY AS DIRECTED 08/29/20   [provider]  Accu-Chek Softclix Lancets lancets SMARTSIG:Topical 1-4 Times Daily 08/29/20   [provider]  ketoconazole (NIZORAL) 2 % cream Apply 1 Application topically daily. Patient not taking: Reported on 10/30/2022 06/05/22   Junie Spencer, FNP    Physical Exam: Vitals:   10/30/22 1144 10/30/22  1500 10/30/22 1600 10/30/22 1700  BP: (!) 152/86 119/80 118/85 118/78  Pulse: (!) 116 91 79 89  Resp: 20 18 18 18   Temp: 98.8 F (37.1 C)  98.7 F (37.1 C)   TempSrc: Oral     SpO2: 95% 98% 100% 98%  Weight: 97.5 kg     Height: 6' (1.829 m)       General exam: AAO x3, NAD, weak appearing. HEENT:Oral mucosa moist, Ear/Nose WNL grossly, dentition normal. Respiratory system: bilaterally clear ,no wheezing or crackles,no use of accessory muscle Cardiovascular system: S1 & S2 +, No JVD,. Gastrointestinal system: Abdomen soft,  RLQ tender no guarding or rigidity,ND, BS+ Nervous System:Alert, awake, moving extremities and grossly nonfocal Extremities: No edema, distal peripheral pulses palpable.  Skin: No rashes,no icterus. MSK: Normal muscle bulk,tone, power   Labs on Admission: I have personally reviewed following labs and imaging studies  CBC: Recent Labs  Lab 10/30/22 1205  WBC 9.3  HGB 13.7  HCT 38.6*  MCV 87.3  PLT 335   Basic Metabolic Panel: Recent Labs  Lab 10/30/22 1205  NA 130*  K 4.0  CL 93*  CO2 27  GLUCOSE 244*  BUN 8  CREATININE 0.68  CALCIUM 9.4   Estimated Creatinine Clearance: 121.9 mL/min (by C-G formula based on SCr of 0.68 mg/dL). Recent Labs  Lab 10/30/22 1205  AST 20  ALT 28  ALKPHOS 51  BILITOT 0.5  PROT 7.5  ALBUMIN 4.2   Recent Labs  Lab 10/30/22 1205  LIPASE 24   Urine analysis:    Component Value Date/Time   COLORURINE STRAW (A) 10/30/2022 1205   APPEARANCEUR CLEAR 10/30/2022 1205   LABSPEC 1.011 10/30/2022 1205   PHURINE 7.0 10/30/2022 1205   GLUCOSEU 150 (A) 10/30/2022 1205   HGBUR NEGATIVE 10/30/2022 1205   BILIRUBINUR NEGATIVE 10/30/2022 1205   KETONESUR NEGATIVE 10/30/2022 1205   PROTEINUR NEGATIVE 10/30/2022 1205   NITRITE NEGATIVE 10/30/2022 1205   LEUKOCYTESUR NEGATIVE 10/30/2022 1205    Radiological Exams on Admission: CT ABDOMEN PELVIS W CONTRAST  Result Date: 10/30/2022 CLINICAL DATA:  Recent  diverticulitis, taking antibiotics. One day history of worsening right lower quadrant abdominal pain radiating into the groin. EXAM: CT ABDOMEN AND PELVIS WITH CONTRAST TECHNIQUE: Multidetector CT imaging of the abdomen and pelvis was performed using the standard protocol following bolus administration of intravenous contrast. RADIATION DOSE REDUCTION: This exam was performed according to the departmental dose-optimization program which includes automated exposure control, adjustment of the mA and/or kV according to patient size and/or use of iterative reconstruction technique. CONTRAST:  OMNIPAQUE IOHEXOL 300 MG/ML  SOLN COMPARISON:  None Available. FINDINGS: Lower chest: No focal consolidation or pulmonary nodule in the lung bases. No pleural effusion or pneumothorax demonstrated. Partially imaged heart size is normal. Coronary artery calcifications. Hepatobiliary: No focal hepatic lesions. No intra or extrahepatic biliary ductal dilation. Underdistended gallbladder. Pancreas: No focal lesions or main ductal dilation. Spleen: Normal in size without focal abnormality. Adrenals/Urinary Tract: No adrenal nodules. No suspicious renal mass, calculi or hydronephrosis. No focal bladder wall thickening. Stomach/Bowel: Normal appearance of the stomach. Diffuse colonic diverticula. Long segment mural thickening of the sigmoid colon. Ill-defined hypodensity involving the sigmoid colon in the right lower quadrant measures 2.1 x 2.1 cm (2:73). Normal appendix. Vascular/Lymphatic: Aortic atherosclerosis. 1.1 cm peripancreatic lymph node (2:24). Reproductive: Prostate is unremarkable. Other: No free fluid, fluid collection, or free air. Peritoneal thickening and enhancement along the inferior abdomen. Musculoskeletal: No acute or abnormal lytic or blastic osseous lesions. Multilevel degenerative changes of the partially imaged thoracic and lumbar spine. Small fat-containing left inguinal hernia. IMPRESSION: 1. Long segment  mural thickening of the sigmoid colon with ill-defined hypodensity involving the sigmoid colon in the right lower quadrant. Findings are suspicious for acute diverticulitis with developing intramural abscess. No drainable fluid collection. 2. Peritoneal thickening and enhancement along the inferior abdomen, likely reactive. 3. A 1.1 cm peripancreatic lymph node is nonspecific. 4. Aortic Atherosclerosis (ICD10-I70.0). Coronary artery calcifications. Assessment for potential risk factor modification, dietary therapy or pharmacologic therapy may be warranted, if clinically indicated. Electronically Signed   By: Agustin Cree M.D.   On: 10/30/2022 17:01      Lanae Boast MD [facility]  If 7PM-7AM, please contact night-coverage [email or web address]  10/30/2022, 6:32 PM

## 2022-10-30 NOTE — ED Notes (Signed)
ED TO INPATIENT HANDOFF REPORT  ED Nurse Name and Phone #: Jeanmarie Hubert EMT-P  S Name/Age/Gender Ricardo Luna. 57 y.o. male Room/Bed: WA20/WA20  Code Status   Code Status: Full Code  Home/SNF/Other Home Patient oriented to: self, place, time, and situation Is this baseline? Yes   Triage Complete: Triage complete  Chief Complaint Diverticulitis [K57.92]  Triage Note Pt arrived via POV, c/o RLQ abd pain, radiating down into groin. Started yesterday, worsening today. Denies any n/v or diarrhea. Has been taking abx for recent diverticulitis.    Allergies Allergies  Allergen Reactions   Penicillins     Level of Care/Admitting Diagnosis ED Disposition     ED Disposition  Admit   Condition  --   Comment  H[facility] [[id number]]  Level of Care: Med-Surg [16]  May admit patient to [facility] or [facility] if equivalent level of care is available:: No  Covid Evaluation: Asymptomatic - no recent exposure (last 10 days) testing not required  Diagnosis: Diverticulitis [161096]  Admitting Physician: Lanae Boast [[id number]]  Attending Physician: Lanae Boast [[id number]]  Certification:: I certify this patient will need inpatient services for at least 2 midnights  Expected Medical Readiness: 11/01/2022          B Medical/Surgery History Past Medical History:  Diagnosis Date   Diverticulitis    Hypercholesteremia    Hypertension    Type 2 diabetes mellitus (HCC)    History reviewed. No pertinent surgical history.   A IV Location/Drains/Wounds Patient Lines/Drains/Airways Status     Active Line/Drains/Airways     Name Placement date Placement time Site Days   Peripheral IV 10/30/22 20 G Left Hand 10/30/22  1503  Hand  less than 1            Intake/Output Last 24 hours No intake or output data in the 24 hours ending 10/30/22 1933  Labs/Imaging Results for orders placed or performed during the hospital encounter of  10/30/22 (from the past 48 hour(s))  Lipase, blood     Status: None   Collection Time: 10/30/22 12:05 PM  Result Value Ref Range   Lipase 24 11 - 51 U/L    Comment: Performed at [facility], [address], [place], [place] [address]  Comprehensive metabolic panel     Status: Abnormal   Collection Time: 10/30/22 12:05 PM  Result Value Ref Range   Sodium 130 (L) 135 - 145 mmol/L   Potassium 4.0 3.5 - 5.1 mmol/L   Chloride 93 (L) 98 - 111 mmol/L   CO2 27 22 - 32 mmol/L   Glucose, Bld 244 (H) 70 - 99 mg/dL    Comment: Glucose reference range applies only to samples taken after fasting for at least 8 hours.   BUN 8 6 - 20 mg/dL   Creatinine, Ser 1.30 0.61 - 1.24 mg/dL   Calcium 9.4 8.9 - 86.5 mg/dL   Total Protein 7.5 6.5 - 8.1 g/dL   Albumin 4.2 3.5 - 5.0 g/dL   AST 20 15 - 41 U/L   ALT 28 0 - 44 U/L   Alkaline Phosphatase 51 38 - 126 U/L   Total Bilirubin 0.5 0.3 - 1.2 mg/dL   GFR, Estimated >78 >46 mL/min    Comment: (NOTE) Calculated using the CKD-EPI Creatinine Equation (2021)    Anion gap 10 5 - 15    Comment: Performed at [facility], [address] [place], [place] [address]  CBC     Status: Abnormal   Collection Time: 10/30/22 12:05 PM  Result Value Ref Range   WBC 9.3 4.0 - 10.5 K/uL   RBC 4.42 4.22 - 5.81 MIL/uL   Hemoglobin 13.7 13.0 - 17.0 g/dL   HCT 95.2 (L) 84.1 - 32.4 %   MCV 87.3 80.0 - 100.0 fL   MCH 31.0 26.0 - 34.0 pg   MCHC 35.5 30.0 - 36.0 g/dL   RDW 40.1 02.7 - 25.3 %   Platelets 335 150 - 400 K/uL   nRBC 0.0 0.0 - 0.2 %    Comment: Performed at [facility], [address], [place], [place] [address]  Urinalysis, Routine w reflex microscopic -Urine, Clean Catch     Status: Abnormal   Collection Time: 10/30/22 12:05 PM  Result Value Ref Range   Color, Urine STRAW (A) YELLOW   APPearance CLEAR CLEAR   Specific Gravity, Urine 1.011 1.005 - 1.030   pH 7.0 5.0 - 8.0   Glucose, UA 150 (A)  NEGATIVE mg/dL   Hgb urine dipstick NEGATIVE NEGATIVE   Bilirubin Urine NEGATIVE NEGATIVE   Ketones, ur NEGATIVE NEGATIVE mg/dL   Protein, ur NEGATIVE NEGATIVE mg/dL   Nitrite NEGATIVE NEGATIVE   Leukocytes,Ua NEGATIVE NEGATIVE    Comment: Performed at [facility], [address] [place], [place] [address]   CT ABDOMEN PELVIS W CONTRAST  Result Date: 10/30/2022 CLINICAL DATA:  Recent diverticulitis, taking antibiotics. One day history of worsening right lower quadrant abdominal pain radiating into the groin. EXAM: CT ABDOMEN AND PELVIS WITH CONTRAST TECHNIQUE: Multidetector CT imaging of the abdomen and pelvis was performed using the standard protocol following bolus administration of intravenous contrast. RADIATION DOSE REDUCTION: This exam was performed according to the departmental dose-optimization program which includes automated exposure control, adjustment of the mA and/or kV according to patient size and/or use of iterative reconstruction technique. CONTRAST:  OMNIPAQUE IOHEXOL 300 MG/ML  SOLN COMPARISON:  None Available. FINDINGS: Lower chest: No focal consolidation or pulmonary nodule in the lung bases. No pleural effusion or pneumothorax demonstrated. Partially imaged heart size is normal. Coronary artery calcifications. Hepatobiliary: No focal hepatic lesions. No intra or extrahepatic biliary ductal dilation. Underdistended gallbladder. Pancreas: No focal lesions or main ductal dilation. Spleen: Normal in size without focal abnormality. Adrenals/Urinary Tract: No adrenal nodules. No suspicious renal mass, calculi or hydronephrosis. No focal bladder wall thickening. Stomach/Bowel: Normal appearance of the stomach. Diffuse colonic diverticula. Long segment mural thickening of the sigmoid colon. Ill-defined hypodensity involving the sigmoid colon in the right lower quadrant measures 2.1 x 2.1 cm (2:73). Normal appendix. Vascular/Lymphatic: Aortic atherosclerosis. 1.1 cm  peripancreatic lymph node (2:24). Reproductive: Prostate is unremarkable. Other: No free fluid, fluid collection, or free air. Peritoneal thickening and enhancement along the inferior abdomen. Musculoskeletal: No acute or abnormal lytic or blastic osseous lesions. Multilevel degenerative changes of the partially imaged thoracic and lumbar spine. Small fat-containing left inguinal hernia. IMPRESSION: 1. Long segment mural thickening of the sigmoid colon with ill-defined hypodensity involving the sigmoid colon in the right lower quadrant. Findings are suspicious for acute diverticulitis with developing intramural abscess. No drainable fluid collection. 2. Peritoneal thickening and enhancement along the inferior abdomen, likely reactive. 3. A 1.1 cm peripancreatic lymph node is nonspecific. 4. Aortic Atherosclerosis (ICD10-I70.0). Coronary artery calcifications. Assessment for potential risk factor modification, dietary therapy or pharmacologic therapy may be warranted, if clinically indicated. Electronically Signed   By: Milus Height.D.  On: 10/30/2022 17:01    Pending Labs Unresulted Labs (From admission, onward)     Start     Ordered   11/06/22 0500  Creatinine, serum  (enoxaparin (LOVENOX)    CrCl >/= 30 ml/min)  Weekly,   R     Comments: while on enoxaparin therapy    10/30/22 1832   10/31/22 0500  Hemoglobin A1c  (Glycemic Control (SSI)  Q 4 Hours / Glycemic Control (SSI)  AC +/- HS)  Tomorrow morning,   R       Comments: To assess prior glycemic control    10/30/22 1832   10/31/22 0500  Basic metabolic panel  Tomorrow morning,   R        10/30/22 1832   10/31/22 0500  CBC  Tomorrow morning,   R        10/30/22 1832            Vitals/Pain Today's Vitals   10/30/22 1500 10/30/22 1501 10/30/22 1600 10/30/22 1700  BP: 119/80  118/85 118/78  Pulse: 91  79 89  Resp: 18  18 18   Temp:   98.7 F (37.1 C)   TempSrc:      SpO2: 98%  100% 98%  Weight:      Height:      PainSc:  8        Isolation Precautions No active isolations  Medications Medications  ciprofloxacin (CIPRO) IVPB 400 mg (400 mg Intravenous New Bag/Given 10/30/22 1727)    And  metroNIDAZOLE (FLAGYL) IVPB 500 mg (500 mg Intravenous New Bag/Given 10/30/22 1929)  lactated ringers infusion (has no administration in time range)  enoxaparin (LOVENOX) injection 40 mg (has no administration in time range)  insulin aspart (novoLOG) injection 0-9 Units (has no administration in time range)  acetaminophen (TYLENOL) tablet 650 mg (has no administration in time range)    Or  acetaminophen (TYLENOL) suppository 650 mg (has no administration in time range)  oxyCODONE (Oxy IR/ROXICODONE) immediate release tablet 5 mg (has no administration in time range)  morphine (PF) 2 MG/ML injection 2 mg (2 mg Intravenous Given 10/30/22 1921)  ondansetron (ZOFRAN) tablet 4 mg ( Oral See Alternative 10/30/22 1922)    Or  ondansetron (ZOFRAN) injection 4 mg (4 mg Intravenous Given 10/30/22 1922)  ciprofloxacin (CIPRO) IVPB 400 mg (has no administration in time range)  metroNIDAZOLE (FLAGYL) IVPB 500 mg (has no administration in time range)  sodium chloride 0.9 % bolus 1,000 mL (0 mLs Intravenous Stopped 10/30/22 1714)  ketorolac (TORADOL) 30 MG/ML injection 30 mg (30 mg Intravenous Given 10/30/22 1516)  iohexol (OMNIPAQUE) 300 MG/ML solution 100 mL (100 mLs Intravenous Contrast Given 10/30/22 1535)    Mobility walks     Focused Assessments See Chart   R Recommendations: See Admitting Provider Note  Report given to: Rowe Clack RN

## 2022-10-30 NOTE — ED Triage Notes (Signed)
Pt arrived via POV, c/o RLQ abd pain, radiating down into groin. Started yesterday, worsening today. Denies any n/v or diarrhea. Has been taking abx for recent diverticulitis.

## 2022-10-30 NOTE — ED Notes (Signed)
Pt transported to CT ?

## 2022-10-30 NOTE — Hospital Course (Addendum)
Ricardo Luna is a 57 year old male with PMH recurrent diverticulitis, DM II, HTN, HLD who presented with right lower quadrant abdominal pain.  He had recently been on outpatient antibiotic course and after finishing antibiotics he began feeling bad after approximately 2 days.  He therefore presented for further evaluation. CT abdomen/pelvis was obtained which showed diverticulitis involving right lower quadrant with findings suspicious for developing intramural abscess but no drainable fluid collection. He was started on ciprofloxacin and Flagyl and admitted for further workup.  General surgery was consulted on admission. He had clinical improvement on IV antibiotics.  Diet was slowly advanced which he tolerated. He was also able to undergo amoxicillin challenge test with no reaction and tolerated well.  In the future he can be considered for use of Zosyn or Augmentin.  He will follow-up outpatient with general surgery for discussion of elective resection.  He will need referral to GI for colonoscopy after 4 to 6 weeks. Extended course of ciprofloxacin/Flagyl continued at discharge.

## 2022-10-30 NOTE — ED Provider Notes (Signed)
[place] EMERGENCY DEPARTMENT AT [facility] Provider Note   CSN: [id number] Arrival date & time: 10/30/22  1139     History  Chief Complaint  Patient presents with   Abdominal Pain    Ricardo Rehl. is a 57 y.o. male.  Past medical history of diverticulitis presents to the ED for abdominal pain.  Patient was recently treated with outpatient antibiotics for diverticulitis and completed his course 2 days ago.  During the last 2 days he has experienced significant right lower quadrant discomfort which is qualitatively different from his typical left lower quadrant discomfort associated with diverticulitis flares.  Some nausea and vomiting earlier today.  No changes in bowel habits.  Denies fevers chills chest pain shortness of breath.  No prior history of abdominal surgeries.   Abdominal Pain Associated symptoms: nausea and vomiting        Home Medications Prior to Admission medications   Medication Sig Start Date End Date Taking? Authorizing Provider  ACCU-CHEK GUIDE test strip USE 1 STRIP TO CHECK GLUCOSE UP TO 4 TIMES DAILY AS DIRECTED 08/29/20   [provider]  Accu-Chek Softclix Lancets lancets SMARTSIG:Topical 1-4 Times Daily 08/29/20   [provider]  aspirin EC 81 MG tablet Take 81 mg by mouth daily. Swallow whole.    [provider]  atorvastatin (LIPITOR) 10 MG tablet Take 1 tablet (10 mg total) by mouth daily. 06/05/22   Junie Spencer, FNP  ciprofloxacin (CIPRO) 500 MG tablet Take 1 tablet (500 mg total) by mouth 2 (two) times daily. 10/16/22   Jannifer Rodney A, FNP  gabapentin (NEURONTIN) 100 MG capsule Take 1-3 capsules (100-300 mg total) by mouth daily as needed. 06/05/22   Jannifer Rodney A, FNP  hydrochlorothiazide (HYDRODIURIL) 50 MG tablet Take 1 tablet (50 mg total) by mouth daily. 06/05/22   Junie Spencer, FNP  ketoconazole (NIZORAL) 2 % cream Apply 1 Application topically daily. 06/05/22   Jannifer Rodney A, FNP   lisinopril (ZESTRIL) 20 MG tablet Take 1 tablet (20 mg total) by mouth daily. 06/05/22   Junie Spencer, FNP  metroNIDAZOLE (FLAGYL) 500 MG tablet Take 1 tablet (500 mg total) by mouth 3 (three) times daily. 10/16/22   Junie Spencer, FNP  omeprazole (PRILOSEC) 20 MG capsule Take 1 capsule (20 mg total) by mouth daily. 06/05/22   Junie Spencer, FNP  SitaGLIPtin-MetFORMIN HCl (JANUMET XR) 50-1000 MG TB24 Take 1 tablet by mouth 2 (two) times daily. 06/05/22   Junie Spencer, FNP      Allergies    Penicillins    Review of Systems   Review of Systems  Gastrointestinal:  Positive for abdominal pain, nausea and vomiting.    Physical Exam Updated Vital Signs BP 118/78   Pulse 89   Temp 98.7 F (37.1 C)   Resp 18   Ht 6' (1.829 m)   Wt 97.5 kg   SpO2 98%   BMI 29.16 kg/m  Physical Exam Vitals and nursing note reviewed.  HENT:     Head: Normocephalic and atraumatic.  Eyes:     Pupils: Pupils are equal, round, and reactive to light.  Cardiovascular:     Rate and Rhythm: Normal rate and regular rhythm.  Pulmonary:     Effort: Pulmonary effort is normal.     Breath sounds: Normal breath sounds.  Abdominal:     Palpations: Abdomen is soft.     Tenderness: There is abdominal tenderness in the right lower  quadrant. There is no guarding or rebound.  Skin:    General: Skin is warm and dry.  Neurological:     Mental Status: He is alert.  Psychiatric:        Mood and Affect: Mood normal.     ED Results / Procedures / Treatments   Labs (all labs ordered are listed, but only abnormal results are displayed) Labs Reviewed  COMPREHENSIVE METABOLIC PANEL - Abnormal; Notable for the following components:      Result Value   Sodium 130 (*)    Chloride 93 (*)    Glucose, Bld 244 (*)    All other components within normal limits  CBC - Abnormal; Notable for the following components:   HCT 38.6 (*)    All other components within normal limits  URINALYSIS, ROUTINE W REFLEX  MICROSCOPIC - Abnormal; Notable for the following components:   Color, Urine STRAW (*)    Glucose, UA 150 (*)    All other components within normal limits  LIPASE, BLOOD    EKG None  Radiology CT ABDOMEN PELVIS W CONTRAST  Result Date: 10/30/2022 CLINICAL DATA:  Recent diverticulitis, taking antibiotics. One day history of worsening right lower quadrant abdominal pain radiating into the groin. EXAM: CT ABDOMEN AND PELVIS WITH CONTRAST TECHNIQUE: Multidetector CT imaging of the abdomen and pelvis was performed using the standard protocol following bolus administration of intravenous contrast. RADIATION DOSE REDUCTION: This exam was performed according to the departmental dose-optimization program which includes automated exposure control, adjustment of the mA and/or kV according to patient size and/or use of iterative reconstruction technique. CONTRAST:  OMNIPAQUE IOHEXOL 300 MG/ML  SOLN COMPARISON:  None Available. FINDINGS: Lower chest: No focal consolidation or pulmonary nodule in the lung bases. No pleural effusion or pneumothorax demonstrated. Partially imaged heart size is normal. Coronary artery calcifications. Hepatobiliary: No focal hepatic lesions. No intra or extrahepatic biliary ductal dilation. Underdistended gallbladder. Pancreas: No focal lesions or main ductal dilation. Spleen: Normal in size without focal abnormality. Adrenals/Urinary Tract: No adrenal nodules. No suspicious renal mass, calculi or hydronephrosis. No focal bladder wall thickening. Stomach/Bowel: Normal appearance of the stomach. Diffuse colonic diverticula. Long segment mural thickening of the sigmoid colon. Ill-defined hypodensity involving the sigmoid colon in the right lower quadrant measures 2.1 x 2.1 cm (2:73). Normal appendix. Vascular/Lymphatic: Aortic atherosclerosis. 1.1 cm peripancreatic lymph node (2:24). Reproductive: Prostate is unremarkable. Other: No free fluid, fluid collection, or free air.  Peritoneal thickening and enhancement along the inferior abdomen. Musculoskeletal: No acute or abnormal lytic or blastic osseous lesions. Multilevel degenerative changes of the partially imaged thoracic and lumbar spine. Small fat-containing left inguinal hernia. IMPRESSION: 1. Long segment mural thickening of the sigmoid colon with ill-defined hypodensity involving the sigmoid colon in the right lower quadrant. Findings are suspicious for acute diverticulitis with developing intramural abscess. No drainable fluid collection. 2. Peritoneal thickening and enhancement along the inferior abdomen, likely reactive. 3. A 1.1 cm peripancreatic lymph node is nonspecific. 4. Aortic Atherosclerosis (ICD10-I70.0). Coronary artery calcifications. Assessment for potential risk factor modification, dietary therapy or pharmacologic therapy may be warranted, if clinically indicated. Electronically Signed   By: Agustin Cree M.D.   On: 10/30/2022 17:01    Procedures Procedures    Medications Ordered in ED Medications  ciprofloxacin (CIPRO) IVPB 400 mg (400 mg Intravenous New Bag/Given 10/30/22 1727)    And  metroNIDAZOLE (FLAGYL) IVPB 500 mg (has no administration in time range)  sodium chloride 0.9 % bolus 1,000 mL (0  mLs Intravenous Stopped 10/30/22 1714)  ketorolac (TORADOL) 30 MG/ML injection 30 mg (30 mg Intravenous Given 10/30/22 1516)  iohexol (OMNIPAQUE) 300 MG/ML solution 100 mL (100 mLs Intravenous Contrast Given 10/30/22 1535)    ED Course/ Medical Decision Making/ A&P Clinical Course as of 10/30/22 1734  Fri Oct 30, 2022  1731 CT abdomen pelvis with IV contrast shows diverticulitis with intramural abscess.  No drainable collection.  Informed patient of the CT findings.  Will start on IV antibiotics Cipro and Flagyl and admit to medicine [MP]    Clinical Course User Index [MP] Royanne Foots, DO                                 Medical Decision Making 58 year old male with history as above including  recent outpatient treatment for acute diverticulitis returns for abdominal discomfort nausea and vomiting x 2 days.  He is normotensive and afebrile pulm initial assessment.  Exam notable for right lower quadrant tenderness on exam without rebound rigidity or guarding.  Differential diagnosis include refractory diverticulitis, appendicitis, nephrolithiasis, viral gastritis and urinary tract infection.  Will obtain laboratory workup including CBC, CMP, lipase and UA along with CT abdomen pelvis with IV contrast.  Will provide IV fluids and Toradol for analgesia  Amount and/or Complexity of Data Reviewed Labs: ordered. Radiology: ordered.  Risk Prescription drug management. Decision regarding hospitalization.           Final Clinical Impression(s) / ED Diagnoses Final diagnoses:  Acute diverticulitis    Rx / DC Orders ED Discharge Orders     None         Royanne Foots, DO 10/30/22 1734

## 2022-10-31 DIAGNOSIS — K5792 Diverticulitis of intestine, part unspecified, without perforation or abscess without bleeding: Secondary | ICD-10-CM | POA: Diagnosis not present

## 2022-10-31 LAB — BASIC METABOLIC PANEL
Anion gap: 8 (ref 5–15)
BUN: 7 mg/dL (ref 6–20)
CO2: 26 mmol/L (ref 22–32)
Calcium: 8.5 mg/dL — ABNORMAL LOW (ref 8.9–10.3)
Chloride: 98 mmol/L (ref 98–111)
Creatinine, Ser: 0.8 mg/dL (ref 0.61–1.24)
GFR, Estimated: >60 mL/min (ref >60)
Glucose, Bld: 145 mg/dL — ABNORMAL HIGH (ref 70–99)
Potassium: 3.7 mmol/L (ref 3.5–5.1)
Sodium: 132 mmol/L — ABNORMAL LOW (ref 135–145)

## 2022-10-31 LAB — GLUCOSE, CAPILLARY
Glucose-Capillary: 158 mg/dL — ABNORMAL HIGH (ref 70–99)
Glucose-Capillary: 133 mg/dL — ABNORMAL HIGH (ref 70–99)
Glucose-Capillary: 182 mg/dL — ABNORMAL HIGH (ref 70–99)
Glucose-Capillary: 125 mg/dL — ABNORMAL HIGH (ref 70–99)
Glucose-Capillary: 158 mg/dL — ABNORMAL HIGH (ref 70–99)

## 2022-10-31 LAB — CBC
HCT: 34.7 % — ABNORMAL LOW (ref 39.0–52.0)
Hemoglobin: 11.9 g/dL — ABNORMAL LOW (ref 13.0–17.0)
MCH: 30.9 pg (ref 26.0–34.0)
MCHC: 34.3 g/dL (ref 30.0–36.0)
MCV: 90.1 fL (ref 80.0–100.0)
Platelets: 284 10*3/uL (ref 150–400)
RBC: 3.85 MIL/uL — ABNORMAL LOW (ref 4.22–5.81)
RDW: 12 % (ref 11.5–15.5)
WBC: 7 10*3/uL (ref 4.0–10.5)
nRBC: 0 % (ref 0.0–0.2)

## 2022-10-31 MED ORDER — NAPHAZOLINE-GLYCERIN 0.012-0.25 % OP SOLN: 1.0000 [drp] | Freq: Four times a day (QID) | OPHTHALMIC | Status: DC | PRN

## 2022-10-31 MED ORDER — PANTOPRAZOLE SODIUM 40 MG PO TBEC
40.0000 mg | DELAYED_RELEASE_TABLET | Freq: Every day | ORAL | Status: DC
  Administered 2022-10-31 – 2022-11-01 (×2): 40 mg via ORAL
  Filled 2022-10-31 (×2): qty 1

## 2022-10-31 MED ORDER — HYDROMORPHONE HCL 1 MG/ML IJ SOLN
0.5000 mg | INTRAMUSCULAR | Status: DC | PRN
  Administered 2022-10-31: 2 mg via INTRAVENOUS
  Filled 2022-10-31: qty 2

## 2022-10-31 MED ORDER — ATORVASTATIN CALCIUM 10 MG PO TABS
10.0000 mg | ORAL_TABLET | Freq: Every day | ORAL | Status: DC
  Administered 2022-10-31: 10 mg via ORAL
  Filled 2022-10-31: qty 1

## 2022-10-31 MED ORDER — ACETAMINOPHEN 500 MG PO TABS
1000.0000 mg | ORAL_TABLET | Freq: Four times a day (QID) | ORAL | Status: DC
  Administered 2022-10-31 – 2022-11-01 (×4): 1000 mg via ORAL
  Filled 2022-10-31 (×4): qty 2

## 2022-10-31 MED ORDER — HYDROMORPHONE HCL 1 MG/ML IJ SOLN: 1.0000 mg | INTRAMUSCULAR | Status: DC | PRN

## 2022-10-31 MED ORDER — METHOCARBAMOL 1000 MG/10ML IJ SOLN: 1000.0000 mg | Freq: Four times a day (QID) | INTRAVENOUS | Status: DC | PRN

## 2022-10-31 MED ORDER — SALINE SPRAY 0.65 % NA SOLN: 1.0000 | Freq: Four times a day (QID) | NASAL | Status: DC | PRN

## 2022-10-31 MED ORDER — INSULIN ASPART 100 UNIT/ML IJ SOLN
0.0000 [IU] | Freq: Three times a day (TID) | INTRAMUSCULAR | Status: DC
  Administered 2022-10-31: 1 [IU] via SUBCUTANEOUS
  Administered 2022-10-31 – 2022-11-01 (×2): 2 [IU] via SUBCUTANEOUS

## 2022-10-31 MED ORDER — ALUM & MAG HYDROXIDE-SIMETH 200-200-20 MG/5ML PO SUSP: 30.0000 mL | Freq: Four times a day (QID) | ORAL | Status: DC | PRN

## 2022-10-31 MED ORDER — LACTATED RINGERS IV BOLUS: 1000.0000 mL | Freq: Three times a day (TID) | INTRAVENOUS | Status: DC | PRN

## 2022-10-31 MED ORDER — MENTHOL 3 MG MT LOZG: 1.0000 | LOZENGE | OROMUCOSAL | Status: DC | PRN

## 2022-10-31 MED ORDER — SODIUM CHLORIDE 0.9 % IV SOLN: 8.0000 mg | Freq: Four times a day (QID) | INTRAVENOUS | Status: DC | PRN

## 2022-10-31 MED ORDER — PROCHLORPERAZINE EDISYLATE 10 MG/2ML IJ SOLN: 10.0000 mg | INTRAMUSCULAR | Status: DC | PRN

## 2022-10-31 MED ORDER — PHENOL 1.4 % MT LIQD: 2.0000 | OROMUCOSAL | Status: DC | PRN

## 2022-10-31 MED ORDER — SIMETHICONE 40 MG/0.6ML PO SUSP: 160.0000 mg | Freq: Four times a day (QID) | ORAL | Status: DC | PRN

## 2022-10-31 MED ORDER — SODIUM CHLORIDE 0.9 % IV SOLN
2.0000 g | Freq: Three times a day (TID) | INTRAVENOUS | Status: DC
  Administered 2022-10-31 – 2022-11-01 (×3): 2 g via INTRAVENOUS
  Filled 2022-10-31 (×3): qty 12.5

## 2022-10-31 MED ORDER — MAGIC MOUTHWASH: 15.0000 mL | Freq: Four times a day (QID) | ORAL | Status: DC | PRN

## 2022-10-31 MED ORDER — ONDANSETRON HCL 4 MG/2ML IJ SOLN: 4.0000 mg | Freq: Four times a day (QID) | INTRAMUSCULAR | Status: DC | PRN

## 2022-10-31 MED ORDER — PROCHLORPERAZINE EDISYLATE 10 MG/2ML IJ SOLN: 5.0000 mg | INTRAMUSCULAR | Status: DC | PRN

## 2022-10-31 NOTE — Consult Note (Addendum)
CC: abd pain  Requesting provider: Dr Frederick Peers  HPI: Ricardo Luna. is an 57 y.o. male who is here for recurrent diverticulitis.  Currently taking PO abx but symptoms worsened.  CT shows diverticulitis with IM abscess.  Last colonoscopy was ~3 yrs ago in [place] per pt.    Past Medical History:  Diagnosis Date   Diverticulitis    Hypercholesteremia    Hypertension    Type 2 diabetes mellitus (HCC)     History reviewed. No pertinent surgical history.  Family History  Problem Relation Age of Onset   Hypertension Mother    Diabetes Mother    Stomach cancer Mother    Diabetes Father    Hypertension Father    Thyroid cancer Daughter     Social:  reports that he has been smoking cigarettes. He has never used smokeless tobacco. He reports current alcohol use. He reports that he does not use drugs.  Allergies:  Allergies  Allergen Reactions   Penicillins     Medications: I have reviewed the patient's current medications.  Results for orders placed or performed during the hospital encounter of 10/30/22 (from the past 48 hour(s))  Lipase, blood     Status: None   Collection Time: 10/30/22 12:05 PM  Result Value Ref Range   Lipase 24 11 - 51 U/L    Comment: Performed at [facility], [address] [place], [place] [address]  Comprehensive metabolic panel     Status: Abnormal   Collection Time: 10/30/22 12:05 PM  Result Value Ref Range   Sodium 130 (L) 135 - 145 mmol/L   Potassium 4.0 3.5 - 5.1 mmol/L   Chloride 93 (L) 98 - 111 mmol/L   CO2 27 22 - 32 mmol/L   Glucose, Bld 244 (H) 70 - 99 mg/dL    Comment: Glucose reference range applies only to samples taken after fasting for at least 8 hours.   BUN 8 6 - 20 mg/dL   Creatinine, Ser 4.78 0.61 - 1.24 mg/dL   Calcium 9.4 8.9 - 29.5 mg/dL   Total Protein 7.5 6.5 - 8.1 g/dL   Albumin 4.2 3.5 - 5.0 g/dL   AST 20 15 - 41 U/L   ALT 28 0 - 44 U/L   Alkaline Phosphatase 51 38 - 126 U/L   Total  Bilirubin 0.5 0.3 - 1.2 mg/dL   GFR, Estimated >62 >13 mL/min    Comment: (NOTE) Calculated using the CKD-EPI Creatinine Equation (2021)    Anion gap 10 5 - 15    Comment: Performed at [facility], [address], [place], [place] [address]  CBC     Status: Abnormal   Collection Time: 10/30/22 12:05 PM  Result Value Ref Range   WBC 9.3 4.0 - 10.5 K/uL   RBC 4.42 4.22 - 5.81 MIL/uL   Hemoglobin 13.7 13.0 - 17.0 g/dL   HCT 84.6 (L) 96.2 - 95.2 %   MCV 87.3 80.0 - 100.0 fL   MCH 31.0 26.0 - 34.0 pg   MCHC 35.5 30.0 - 36.0 g/dL   RDW 84.1 32.4 - 40.1 %   Platelets 335 150 - 400 K/uL   nRBC 0.0 0.0 - 0.2 %    Comment: Performed at [facility], [address], [place], [place] [address]  Urinalysis, Routine w reflex microscopic -Urine, Clean Catch     Status: Abnormal   Collection Time: 10/30/22 12:05 PM  Result Value Ref  Range   Color, Urine STRAW (A) YELLOW   APPearance CLEAR CLEAR   Specific Gravity, Urine 1.011 1.005 - 1.030   pH 7.0 5.0 - 8.0   Glucose, UA 150 (A) NEGATIVE mg/dL   Hgb urine dipstick NEGATIVE NEGATIVE   Bilirubin Urine NEGATIVE NEGATIVE   Ketones, ur NEGATIVE NEGATIVE mg/dL   Protein, ur NEGATIVE NEGATIVE mg/dL   Nitrite NEGATIVE NEGATIVE   Leukocytes,Ua NEGATIVE NEGATIVE    Comment: Performed at [facility], [address] [place], [place] [address]  Glucose, capillary     Status: Abnormal   Collection Time: 10/30/22  9:44 PM  Result Value Ref Range   Glucose-Capillary 156 (H) 70 - 99 mg/dL    Comment: Glucose reference range applies only to samples taken after fasting for at least 8 hours.  Glucose, capillary     Status: Abnormal   Collection Time: 10/31/22  2:59 AM  Result Value Ref Range   Glucose-Capillary 158 (H) 70 - 99 mg/dL    Comment: Glucose reference range applies only to samples taken after fasting for at least 8 hours.  Basic metabolic panel     Status: Abnormal   Collection Time:  10/31/22  4:10 AM  Result Value Ref Range   Sodium 132 (L) 135 - 145 mmol/L   Potassium 3.7 3.5 - 5.1 mmol/L   Chloride 98 98 - 111 mmol/L   CO2 26 22 - 32 mmol/L   Glucose, Bld 145 (H) 70 - 99 mg/dL    Comment: Glucose reference range applies only to samples taken after fasting for at least 8 hours.   BUN 7 6 - 20 mg/dL   Creatinine, Ser 6.04 0.61 - 1.24 mg/dL   Calcium 8.5 (L) 8.9 - 10.3 mg/dL   GFR, Estimated >54 >09 mL/min    Comment: (NOTE) Calculated using the CKD-EPI Creatinine Equation (2021)    Anion gap 8 5 - 15    Comment: Performed at [facility], [address], [place], [place] [address]  CBC     Status: Abnormal   Collection Time: 10/31/22  4:10 AM  Result Value Ref Range   WBC 7.0 4.0 - 10.5 K/uL   RBC 3.85 (L) 4.22 - 5.81 MIL/uL   Hemoglobin 11.9 (L) 13.0 - 17.0 g/dL   HCT 47.8 (L) 29.5 - 62.1 %   MCV 90.1 80.0 - 100.0 fL   MCH 30.9 26.0 - 34.0 pg   MCHC 34.3 30.0 - 36.0 g/dL   RDW 30.8 65.7 - 84.6 %   Platelets 284 150 - 400 K/uL   nRBC 0.0 0.0 - 0.2 %    Comment: Performed at [facility], [address] [place], [place] [address]  Glucose, capillary     Status: Abnormal   Collection Time: 10/31/22  9:02 AM  Result Value Ref Range   Glucose-Capillary 133 (H) 70 - 99 mg/dL    Comment: Glucose reference range applies only to samples taken after fasting for at least 8 hours.   Comment 1 Notify RN    Comment 2 Document in Chart     CT ABDOMEN PELVIS W CONTRAST  Result Date: 10/30/2022 CLINICAL DATA:  Recent diverticulitis, taking antibiotics. One day history of worsening right lower quadrant abdominal pain radiating into the groin. EXAM: CT ABDOMEN AND PELVIS WITH CONTRAST TECHNIQUE: Multidetector CT imaging of the abdomen and pelvis was performed using the standard protocol following bolus administration of intravenous contrast. RADIATION DOSE REDUCTION: This exam  was performed according to the departmental  dose-optimization program which includes automated exposure control, adjustment of the mA and/or kV according to patient size and/or use of iterative reconstruction technique. CONTRAST:  OMNIPAQUE IOHEXOL 300 MG/ML  SOLN COMPARISON:  None Available. FINDINGS: Lower chest: No focal consolidation or pulmonary nodule in the lung bases. No pleural effusion or pneumothorax demonstrated. Partially imaged heart size is normal. Coronary artery calcifications. Hepatobiliary: No focal hepatic lesions. No intra or extrahepatic biliary ductal dilation. Underdistended gallbladder. Pancreas: No focal lesions or main ductal dilation. Spleen: Normal in size without focal abnormality. Adrenals/Urinary Tract: No adrenal nodules. No suspicious renal mass, calculi or hydronephrosis. No focal bladder wall thickening. Stomach/Bowel: Normal appearance of the stomach. Diffuse colonic diverticula. Long segment mural thickening of the sigmoid colon. Ill-defined hypodensity involving the sigmoid colon in the right lower quadrant measures 2.1 x 2.1 cm (2:73). Normal appendix. Vascular/Lymphatic: Aortic atherosclerosis. 1.1 cm peripancreatic lymph node (2:24). Reproductive: Prostate is unremarkable. Other: No free fluid, fluid collection, or free air. Peritoneal thickening and enhancement along the inferior abdomen. Musculoskeletal: No acute or abnormal lytic or blastic osseous lesions. Multilevel degenerative changes of the partially imaged thoracic and lumbar spine. Small fat-containing left inguinal hernia. IMPRESSION: 1. Long segment mural thickening of the sigmoid colon with ill-defined hypodensity involving the sigmoid colon in the right lower quadrant. Findings are suspicious for acute diverticulitis with developing intramural abscess. No drainable fluid collection. 2. Peritoneal thickening and enhancement along the inferior abdomen, likely reactive. 3. A 1.1 cm peripancreatic lymph node is nonspecific. 4. Aortic Atherosclerosis  (ICD10-I70.0). Coronary artery calcifications. Assessment for potential risk factor modification, dietary therapy or pharmacologic therapy may be warranted, if clinically indicated. Electronically Signed   By: Agustin Cree M.D.   On: 10/30/2022 17:01    ROS - all of the below systems have been reviewed with the patient and positives are indicated with bold text General: chills, fever or night sweats Eyes: blurry vision or double vision ENT: epistaxis or sore throat Hematologic/Lymphatic: bleeding problems, blood clots or swollen lymph nodes Resp: cough, shortness of breath, or wheezing CV: chest pain or dyspnea on exertion GI: as per HPI GU: dysuria, trouble voiding, or hematuria Neuro: TIA or stroke symptoms    PE Blood pressure 95/64, pulse 66, temperature 97.7 F (36.5 C), temperature source Oral, resp. rate 16, height 6' (1.829 m), weight 97.5 kg, SpO2 96%. Constitutional: NAD; conversant; no deformities Eyes: Moist conjunctiva; no lid lag; anicteric; PERRL Neck: Trachea midline; no thyromegaly Lungs: Normal respiratory effort CV: RRR GI: Abd TTP RLQ MSK: Normal range of motion of extremities; no clubbing/cyanosis Psychiatric: Appropriate affect; alert and oriented x3  Results for orders placed or performed during the hospital encounter of 10/30/22 (from the past 48 hour(s))  Lipase, blood     Status: None   Collection Time: 10/30/22 12:05 PM  Result Value Ref Range   Lipase 24 11 - 51 U/L    Comment: Performed at [facility], [address] [place], [place] [address]  Comprehensive metabolic panel     Status: Abnormal   Collection Time: 10/30/22 12:05 PM  Result Value Ref Range   Sodium 130 (L) 135 - 145 mmol/L   Potassium 4.0 3.5 - 5.1 mmol/L   Chloride 93 (L) 98 - 111 mmol/L   CO2 27 22 - 32 mmol/L   Glucose, Bld 244 (H) 70 - 99 mg/dL    Comment: Glucose reference range applies only to samples taken after fasting for  at least 8 hours.   BUN 8 6 - 20  mg/dL   Creatinine, Ser 2.59 0.61 - 1.24 mg/dL   Calcium 9.4 8.9 - 56.3 mg/dL   Total Protein 7.5 6.5 - 8.1 g/dL   Albumin 4.2 3.5 - 5.0 g/dL   AST 20 15 - 41 U/L   ALT 28 0 - 44 U/L   Alkaline Phosphatase 51 38 - 126 U/L   Total Bilirubin 0.5 0.3 - 1.2 mg/dL   GFR, Estimated >87 >56 mL/min    Comment: (NOTE) Calculated using the CKD-EPI Creatinine Equation (2021)    Anion gap 10 5 - 15    Comment: Performed at [facility], [address], [place], [place] [address]  CBC     Status: Abnormal   Collection Time: 10/30/22 12:05 PM  Result Value Ref Range   WBC 9.3 4.0 - 10.5 K/uL   RBC 4.42 4.22 - 5.81 MIL/uL   Hemoglobin 13.7 13.0 - 17.0 g/dL   HCT 51.8 (L) 84.1 - 66.0 %   MCV 87.3 80.0 - 100.0 fL   MCH 31.0 26.0 - 34.0 pg   MCHC 35.5 30.0 - 36.0 g/dL   RDW 63.0 16.0 - 10.9 %   Platelets 335 150 - 400 K/uL   nRBC 0.0 0.0 - 0.2 %    Comment: Performed at [facility], [address], [place], [place] [address]  Urinalysis, Routine w reflex microscopic -Urine, Clean Catch     Status: Abnormal   Collection Time: 10/30/22 12:05 PM  Result Value Ref Range   Color, Urine STRAW (A) YELLOW   APPearance CLEAR CLEAR   Specific Gravity, Urine 1.011 1.005 - 1.030   pH 7.0 5.0 - 8.0   Glucose, UA 150 (A) NEGATIVE mg/dL   Hgb urine dipstick NEGATIVE NEGATIVE   Bilirubin Urine NEGATIVE NEGATIVE   Ketones, ur NEGATIVE NEGATIVE mg/dL   Protein, ur NEGATIVE NEGATIVE mg/dL   Nitrite NEGATIVE NEGATIVE   Leukocytes,Ua NEGATIVE NEGATIVE    Comment: Performed at [facility], [address] [place], [place] [address]  Glucose, capillary     Status: Abnormal   Collection Time: 10/30/22  9:44 PM  Result Value Ref Range   Glucose-Capillary 156 (H) 70 - 99 mg/dL    Comment: Glucose reference range applies only to samples taken after fasting for at least 8 hours.  Glucose, capillary     Status: Abnormal   Collection Time: 10/31/22  2:59  AM  Result Value Ref Range   Glucose-Capillary 158 (H) 70 - 99 mg/dL    Comment: Glucose reference range applies only to samples taken after fasting for at least 8 hours.  Basic metabolic panel     Status: Abnormal   Collection Time: 10/31/22  4:10 AM  Result Value Ref Range   Sodium 132 (L) 135 - 145 mmol/L   Potassium 3.7 3.5 - 5.1 mmol/L   Chloride 98 98 - 111 mmol/L   CO2 26 22 - 32 mmol/L   Glucose, Bld 145 (H) 70 - 99 mg/dL    Comment: Glucose reference range applies only to samples taken after fasting for at least 8 hours.   BUN 7 6 - 20 mg/dL   Creatinine, Ser 2.54 0.61 - 1.24 mg/dL   Calcium 8.5 (L) 8.9 - 10.3 mg/dL   GFR, Estimated >27 >06 mL/min    Comment: (NOTE) Calculated using the CKD-EPI Creatinine Equation (2021)    Anion gap 8 5 -  15    Comment: Performed at [facility], [address], [place], [place] [address]  CBC     Status: Abnormal   Collection Time: 10/31/22  4:10 AM  Result Value Ref Range   WBC 7.0 4.0 - 10.5 K/uL   RBC 3.85 (L) 4.22 - 5.81 MIL/uL   Hemoglobin 11.9 (L) 13.0 - 17.0 g/dL   HCT 60.4 (L) 54.0 - 98.1 %   MCV 90.1 80.0 - 100.0 fL   MCH 30.9 26.0 - 34.0 pg   MCHC 34.3 30.0 - 36.0 g/dL   RDW 19.1 47.8 - 29.5 %   Platelets 284 150 - 400 K/uL   nRBC 0.0 0.0 - 0.2 %    Comment: Performed at [facility], [address] [place], [place] [address]  Glucose, capillary     Status: Abnormal   Collection Time: 10/31/22  9:02 AM  Result Value Ref Range   Glucose-Capillary 133 (H) 70 - 99 mg/dL    Comment: Glucose reference range applies only to samples taken after fasting for at least 8 hours.   Comment 1 Notify RN    Comment 2 Document in Chart     CT ABDOMEN PELVIS W CONTRAST  Result Date: 10/30/2022 CLINICAL DATA:  Recent diverticulitis, taking antibiotics. One day history of worsening right lower quadrant abdominal pain radiating into the groin. EXAM: CT ABDOMEN AND PELVIS WITH CONTRAST TECHNIQUE:  Multidetector CT imaging of the abdomen and pelvis was performed using the standard protocol following bolus administration of intravenous contrast. RADIATION DOSE REDUCTION: This exam was performed according to the departmental dose-optimization program which includes automated exposure control, adjustment of the mA and/or kV according to patient size and/or use of iterative reconstruction technique. CONTRAST:  OMNIPAQUE IOHEXOL 300 MG/ML  SOLN COMPARISON:  None Available. FINDINGS: Lower chest: No focal consolidation or pulmonary nodule in the lung bases. No pleural effusion or pneumothorax demonstrated. Partially imaged heart size is normal. Coronary artery calcifications. Hepatobiliary: No focal hepatic lesions. No intra or extrahepatic biliary ductal dilation. Underdistended gallbladder. Pancreas: No focal lesions or main ductal dilation. Spleen: Normal in size without focal abnormality. Adrenals/Urinary Tract: No adrenal nodules. No suspicious renal mass, calculi or hydronephrosis. No focal bladder wall thickening. Stomach/Bowel: Normal appearance of the stomach. Diffuse colonic diverticula. Long segment mural thickening of the sigmoid colon. Ill-defined hypodensity involving the sigmoid colon in the right lower quadrant measures 2.1 x 2.1 cm (2:73). Normal appendix. Vascular/Lymphatic: Aortic atherosclerosis. 1.1 cm peripancreatic lymph node (2:24). Reproductive: Prostate is unremarkable. Other: No free fluid, fluid collection, or free air. Peritoneal thickening and enhancement along the inferior abdomen. Musculoskeletal: No acute or abnormal lytic or blastic osseous lesions. Multilevel degenerative changes of the partially imaged thoracic and lumbar spine. Small fat-containing left inguinal hernia. IMPRESSION: 1. Long segment mural thickening of the sigmoid colon with ill-defined hypodensity involving the sigmoid colon in the right lower quadrant. Findings are suspicious for acute diverticulitis with  developing intramural abscess. No drainable fluid collection. 2. Peritoneal thickening and enhancement along the inferior abdomen, likely reactive. 3. A 1.1 cm peripancreatic lymph node is nonspecific. 4. Aortic Atherosclerosis (ICD10-I70.0). Coronary artery calcifications. Assessment for potential risk factor modification, dietary therapy or pharmacologic therapy may be warranted, if clinically indicated. Electronically Signed   By: Agustin Cree M.D.   On: 10/30/2022 17:01     A/P: Roan Soucie. is an 57 y.o. male with recurrent uncomplicated diverticulitis.   We discussed surgical intervention electively to  prevent further recurrences.  He would be opn to this.  Recommend advancing diet as tolerated once symptoms improve, repeat colonoscopy in 6 wks and then f/u in the office for further discussion of elective colectomy.     Vanita Panda, MD  Colorectal and General Surgery [facility] Surgery    moderate decision making.

## 2022-10-31 NOTE — Plan of Care (Signed)

## 2022-10-31 NOTE — Assessment & Plan Note (Signed)
-   On Janumet at home -Continue SSI and CBG monitoring for now

## 2022-10-31 NOTE — Assessment & Plan Note (Addendum)
-   CT A/P on admission notes right lower quadrant diverticulitis with some concern for intramural abscess. Per report: " Long segment mural thickening of the sigmoid colon with ill-defined hypodensity involving the sigmoid colon in the right lower quadrant. Findings are suspicious for acute diverticulitis with developing intramural abscess. No drainable fluid collection." - appreciate general surgery assistance; outpatient followup to discuss elective resection - will ADAT; trial of CLD today - continue cefepime/flagyl (was on cipro/flagyl PTA) - would be a good candidate for PCN challenge testing; he will try to ascertain reaction from his dad and if unable, once feeling a little better, we may do inpatient challenge testing prior to discharge

## 2022-10-31 NOTE — Assessment & Plan Note (Signed)
-  Continue Lipitor °

## 2022-10-31 NOTE — Plan of Care (Signed)
Problem: Clinical Measurements: Goal: Diagnostic test results will improve Outcome: Progressing

## 2022-10-31 NOTE — Progress Notes (Signed)
   10/31/22 1158  TOC Brief Assessment  Insurance and Status Reviewed  Home environment has been reviewed From Home  Prior level of function: Independent  Prior/Current Home Services No current home services  Social Determinants of Health Reivew SDOH reviewed no interventions necessary  Readmission risk has been reviewed Yes [place] Si)  Transition of care needs no transition of care needs at this time

## 2022-10-31 NOTE — Assessment & Plan Note (Signed)
-   resume home regimen

## 2022-10-31 NOTE — Progress Notes (Signed)
Progress Note    Ricardo Luna.   NFA:[id number]  DOB: [birth year]  DOA: 10/30/2022     1 PCP: Junie Spencer, FNP  Initial CC: abdominal pain  Hospital Course: Mr. Goldie is a 57 year old male with PMH recurrent diverticulitis, DM II, HTN, HLD who presented with right lower quadrant abdominal pain.  He had recently been on outpatient antibiotic course and after finishing antibiotics he began feeling bad after approximately 2 days.  He therefore presented for further evaluation. CT abdomen/pelvis was obtained which showed diverticulitis involving right lower quadrant with findings suspicious for developing intramural abscess but no drainable fluid collection. He was started on ciprofloxacin and Flagyl and admitted for further workup.  General surgery was consulted on admission.  Interval History:  Resting in bed when seen with wife present bedside.  Endorses recurrence of symptoms 2 days after completing Cipro/Flagyl outpatient for diverticulitis course. We also discussed his penicillin allergy.  He plans to ask his dad if he remembers reaction when he was a kid otherwise once he is feeling better, he is open to considering doing penicillin challenge testing inpatient. No nausea/vomiting and he is amenable for trial of clear liquid diet today.  Assessment and Plan: * Diverticulitis - CT A/P on admission notes right lower quadrant diverticulitis with some concern for intramural abscess. Per report: " Long segment mural thickening of the sigmoid colon with ill-defined hypodensity involving the sigmoid colon in the right lower quadrant. Findings are suspicious for acute diverticulitis with developing intramural abscess. No drainable fluid collection." - appreciate general surgery assistance; outpatient followup to discuss elective resection - will ADAT; trial of CLD today - continue cefepime/flagyl (was on cipro/flagyl PTA) - would be a good candidate for PCN challenge testing; he will  try to ascertain reaction from his dad and if unable, once feeling a little better, we may do inpatient challenge testing prior to discharge   Mixed hyperlipidemia - Continue Lipitor  Gastroesophageal reflux disease - Continue Maalox and simethicone -Continue PPI  Type 2 diabetes mellitus with hyperglycemia, without long-term current use of insulin (HCC) - On Janumet at home -Continue SSI and CBG monitoring for now  Essential hypertension - BP soft -Hold lisinopril and HCTZ for now   Old records reviewed in assessment of this patient  Antimicrobials: Ciprofloxacin 10/30/2022 >> 10/31/2022 Cefepime 10/31/2022 >> current Flagyl 10/30/2022 >> current  DVT prophylaxis:  enoxaparin (LOVENOX) injection 40 mg Start: 10/30/22 2200   Code Status:   Code Status: Full Code  Mobility Assessment (Last 72 Hours)     Mobility Assessment     Row Name 10/31/22 0930 10/30/22 2027         Does patient have an order for bedrest or is patient medically unstable No - Continue assessment No - Continue assessment      What is the highest level of mobility based on the progressive mobility assessment? Level 6 (Walks independently in room and hall) - Balance while walking in room without assist - Complete Level 6 (Walks independently in room and hall) - Balance while walking in room without assist - Complete               Barriers to discharge: none Disposition Plan:  Home Status is: Inpt  Objective: Blood pressure 100/72, pulse 66, temperature 98.4 F (36.9 C), temperature source Oral, resp. rate 11, height 6' (1.829 m), weight 97.5 kg, SpO2 100%.  Examination:  Physical Exam Constitutional:      General: He is not  in acute distress.    Appearance: Normal appearance.  HENT:     Head: Normocephalic and atraumatic.     Mouth/Throat:     Mouth: Mucous membranes are moist.  Eyes:     Extraocular Movements: Extraocular movements intact.  Cardiovascular:     Rate and Rhythm: Normal  rate and regular rhythm.  Pulmonary:     Effort: Pulmonary effort is normal. No respiratory distress.     Breath sounds: Normal breath sounds. No wheezing.  Abdominal:     General: Bowel sounds are normal. There is no distension.     Palpations: Abdomen is soft.     Tenderness: There is abdominal tenderness (mild) in the right lower quadrant. There is no guarding or rebound.  Musculoskeletal:        General: Normal range of motion.     Cervical back: Normal range of motion and neck supple.  Skin:    General: Skin is warm and dry.  Neurological:     General: No focal deficit present.     Mental Status: He is alert.  Psychiatric:        Mood and Affect: Mood normal.        Behavior: Behavior normal.      Consultants:  General surgery  Procedures:    Data Reviewed: Results for orders placed or performed during the hospital encounter of 10/30/22 (from the past 24 hour(s))  Glucose, capillary     Status: Abnormal   Collection Time: 10/30/22  9:44 PM  Result Value Ref Range   Glucose-Capillary 156 (H) 70 - 99 mg/dL  Glucose, capillary     Status: Abnormal   Collection Time: 10/31/22  2:59 AM  Result Value Ref Range   Glucose-Capillary 158 (H) 70 - 99 mg/dL  Basic metabolic panel     Status: Abnormal   Collection Time: 10/31/22  4:10 AM  Result Value Ref Range   Sodium 132 (L) 135 - 145 mmol/L   Potassium 3.7 3.5 - 5.1 mmol/L   Chloride 98 98 - 111 mmol/L   CO2 26 22 - 32 mmol/L   Glucose, Bld 145 (H) 70 - 99 mg/dL   BUN 7 6 - 20 mg/dL   Creatinine, Ser 1.61 0.61 - 1.24 mg/dL   Calcium 8.5 (L) 8.9 - 10.3 mg/dL   GFR, Estimated >09 >60 mL/min   Anion gap 8 5 - 15  CBC     Status: Abnormal   Collection Time: 10/31/22  4:10 AM  Result Value Ref Range   WBC 7.0 4.0 - 10.5 K/uL   RBC 3.85 (L) 4.22 - 5.81 MIL/uL   Hemoglobin 11.9 (L) 13.0 - 17.0 g/dL   HCT 45.4 (L) 09.8 - 11.9 %   MCV 90.1 80.0 - 100.0 fL   MCH 30.9 26.0 - 34.0 pg   MCHC 34.3 30.0 - 36.0 g/dL   RDW  14.7 82.9 - 56.2 %   Platelets 284 150 - 400 K/uL   nRBC 0.0 0.0 - 0.2 %  Glucose, capillary     Status: Abnormal   Collection Time: 10/31/22  9:02 AM  Result Value Ref Range   Glucose-Capillary 133 (H) 70 - 99 mg/dL   Comment 1 Notify RN    Comment 2 Document in Chart   Glucose, capillary     Status: Abnormal   Collection Time: 10/31/22  2:01 PM  Result Value Ref Range   Glucose-Capillary 182 (H) 70 - 99 mg/dL   Comment 1 Notify RN  Comment 2 Document in Chart     I have reviewed pertinent nursing notes, vitals, labs, and images as necessary. I have ordered labwork to follow up on as indicated.  I have reviewed the last notes from staff over past 24 hours. I have discussed patient's care plan and test results with nursing staff, CM/SW, and other staff as appropriate.  Time spent: Greater than 50% of the 55 minute visit was spent in counseling/coordination of care for the patient as laid out in the A&P.   LOS: 1 day   Lewie Chamber, MD [facility] 10/31/2022, 3:00 PM

## 2022-10-31 NOTE — Assessment & Plan Note (Signed)
Continue PPI ?

## 2022-11-01 DIAGNOSIS — I10 Essential (primary) hypertension: Secondary | ICD-10-CM | POA: Diagnosis not present

## 2022-11-01 DIAGNOSIS — Z0182 Encounter for allergy testing: Secondary | ICD-10-CM

## 2022-11-01 DIAGNOSIS — K5792 Diverticulitis of intestine, part unspecified, without perforation or abscess without bleeding: Secondary | ICD-10-CM | POA: Diagnosis not present

## 2022-11-01 LAB — BASIC METABOLIC PANEL
Anion gap: 9 (ref 5–15)
BUN: 5 mg/dL — ABNORMAL LOW (ref 6–20)
CO2: 24 mmol/L (ref 22–32)
Calcium: 8.8 mg/dL — ABNORMAL LOW (ref 8.9–10.3)
Chloride: 100 mmol/L (ref 98–111)
Creatinine, Ser: 0.56 mg/dL — ABNORMAL LOW (ref 0.61–1.24)
GFR, Estimated: >60 mL/min (ref >60)
Glucose, Bld: 140 mg/dL — ABNORMAL HIGH (ref 70–99)
Potassium: 3.8 mmol/L (ref 3.5–5.1)
Sodium: 133 mmol/L — ABNORMAL LOW (ref 135–145)

## 2022-11-01 LAB — CBC WITH DIFFERENTIAL/PLATELET
Abs Immature Granulocytes: 0.03 10*3/uL (ref 0.00–0.07)
Basophils Absolute: 0 10*3/uL (ref 0.0–0.1)
Basophils Relative: 1 %
Eosinophils Absolute: 0 10*3/uL (ref 0.0–0.5)
Eosinophils Relative: 1 %
HCT: 35.5 % — ABNORMAL LOW (ref 39.0–52.0)
Hemoglobin: 12.2 g/dL — ABNORMAL LOW (ref 13.0–17.0)
Immature Granulocytes: 1 %
Lymphocytes Relative: 34 %
Lymphs Abs: 1.9 10*3/uL (ref 0.7–4.0)
MCH: 30.5 pg (ref 26.0–34.0)
MCHC: 34.4 g/dL (ref 30.0–36.0)
MCV: 88.8 fL (ref 80.0–100.0)
Monocytes Absolute: 0.5 10*3/uL (ref 0.1–1.0)
Monocytes Relative: 10 %
Neutro Abs: 3 10*3/uL (ref 1.7–7.7)
Neutrophils Relative %: 53 %
Platelets: 280 10*3/uL (ref 150–400)
RBC: 4 MIL/uL — ABNORMAL LOW (ref 4.22–5.81)
RDW: 11.9 % (ref 11.5–15.5)
WBC: 5.5 10*3/uL (ref 4.0–10.5)
nRBC: 0 % (ref 0.0–0.2)

## 2022-11-01 LAB — MAGNESIUM: Magnesium: 1.7 mg/dL (ref 1.7–2.4)

## 2022-11-01 LAB — GLUCOSE, CAPILLARY
Glucose-Capillary: 113 mg/dL — ABNORMAL HIGH (ref 70–99)
Glucose-Capillary: 184 mg/dL — ABNORMAL HIGH (ref 70–99)

## 2022-11-01 MED ORDER — METRONIDAZOLE 500 MG PO TABS: 500.0000 mg | ORAL_TABLET | Freq: Three times a day (TID) | ORAL | 0 refills | Status: AC

## 2022-11-01 MED ORDER — EPINEPHRINE 0.3 MG/0.3ML IJ SOAJ: 0.3000 mg | Freq: Once | INTRAMUSCULAR | Status: DC | PRN

## 2022-11-01 MED ORDER — DIPHENHYDRAMINE HCL 50 MG/ML IJ SOLN: 25.0000 mg | Freq: Once | INTRAMUSCULAR | Status: DC | PRN

## 2022-11-01 MED ORDER — AMOXICILLIN 500 MG PO CAPS
500.0000 mg | ORAL_CAPSULE | Freq: Once | ORAL | Status: AC
  Administered 2022-11-01: 500 mg via ORAL
  Filled 2022-11-01: qty 1

## 2022-11-01 MED ORDER — MAGNESIUM SULFATE 2 GM/50ML IV SOLN
2.0000 g | Freq: Once | INTRAVENOUS | Status: AC
  Administered 2022-11-01: 2 g via INTRAVENOUS
  Filled 2022-11-01: qty 50

## 2022-11-01 MED ORDER — AMOXICILLIN 250 MG PO CAPS: 250.0000 mg | ORAL_CAPSULE | Freq: Once | ORAL | Status: DC

## 2022-11-01 MED ORDER — CIPROFLOXACIN HCL 500 MG PO TABS: 500.0000 mg | ORAL_TABLET | Freq: Two times a day (BID) | ORAL | 0 refills | Status: AC

## 2022-11-01 NOTE — Progress Notes (Signed)
Progress Note    Ricardo Luna.   VOH:[id number]  DOB: 27-May-1965  DOA: 10/30/2022     2 PCP: Junie Spencer, FNP  Initial CC: abdominal pain  Hospital Course: Ricardo Luna is a 57 year old male with PMH recurrent diverticulitis, DM II, HTN, HLD who presented with right lower quadrant abdominal pain.  He had recently been on outpatient antibiotic course and after finishing antibiotics he began feeling bad after approximately 2 days.  He therefore presented for further evaluation. CT abdomen/pelvis was obtained which showed diverticulitis involving right lower quadrant with findings suspicious for developing intramural abscess but no drainable fluid collection. He was started on ciprofloxacin and Flagyl and admitted for further workup.  General surgery was consulted on admission.  Interval History:  No events overnight.  Tolerated clear liquids yesterday and we will advance further today. He is also amenable with amoxicillin testing today.  Assessment and Plan: * Diverticulitis - CT A/P on admission notes right lower quadrant diverticulitis with some concern for intramural abscess. Per report: " Long segment mural thickening of the sigmoid colon with ill-defined hypodensity involving the sigmoid colon in the right lower quadrant. Findings are suspicious for acute diverticulitis with developing intramural abscess. No drainable fluid collection." - appreciate general surgery assistance; outpatient followup to discuss elective resection - will ADAT; tolerated CLD; advance to soft diet today - continue cefepime/flagyl (was on cipro/flagyl PTA)  Encounter for allergy testing - childhood allergy to PCN; possible rash reaction - patient amenable to challenge testing; performed 9/15 with amoxicillin dose - no reaction s/p 1 hr after dose; will continue monitoring thru today  Mixed hyperlipidemia - Continue Lipitor  Gastroesophageal reflux disease - Continue Maalox and  simethicone -Continue PPI  Type 2 diabetes mellitus with hyperglycemia, without long-term current use of insulin (HCC) - On Janumet at home -Continue SSI and CBG monitoring for now  Essential hypertension - BP soft -Hold lisinopril and HCTZ for now   Old records reviewed in assessment of this patient  Antimicrobials: Ciprofloxacin 10/30/2022 >> 10/31/2022 Cefepime 10/31/2022 >> current Flagyl 10/30/2022 >> current  DVT prophylaxis:  enoxaparin (LOVENOX) injection 40 mg Start: 10/30/22 2200   Code Status:   Code Status: Full Code  Mobility Assessment (Last 72 Hours)     Mobility Assessment     Row Name 10/31/22 1939 10/31/22 0930 10/30/22 2027       Does patient have an order for bedrest or is patient medically unstable No - Continue assessment No - Continue assessment No - Continue assessment     What is the highest level of mobility based on the progressive mobility assessment? Level 6 (Walks independently in room and hall) - Balance while walking in room without assist - Complete Level 6 (Walks independently in room and hall) - Balance while walking in room without assist - Complete Level 6 (Walks independently in room and hall) - Balance while walking in room without assist - Complete              Barriers to discharge: none Disposition Plan:  Home Status is: Inpt  Objective: Blood pressure (!) 144/75, pulse 62, temperature 98.4 F (36.9 C), temperature source Temporal, resp. rate 20, height 6' (1.829 m), weight 97.5 kg, SpO2 98%.  Examination:  Physical Exam Constitutional:      General: He is not in acute distress.    Appearance: Normal appearance.  HENT:     Head: Normocephalic and atraumatic.     Mouth/Throat:  Mouth: Mucous membranes are moist.  Eyes:     Extraocular Movements: Extraocular movements intact.  Cardiovascular:     Rate and Rhythm: Normal rate and regular rhythm.  Pulmonary:     Effort: Pulmonary effort is normal. No respiratory  distress.     Breath sounds: Normal breath sounds. No wheezing.  Abdominal:     General: Bowel sounds are normal. There is no distension.     Palpations: Abdomen is soft.     Tenderness: There is abdominal tenderness (mild) in the right lower quadrant. There is no guarding or rebound.  Musculoskeletal:        General: Normal range of motion.     Cervical back: Normal range of motion and neck supple.  Skin:    General: Skin is warm and dry.  Neurological:     General: No focal deficit present.     Mental Status: He is alert.  Psychiatric:        Mood and Affect: Mood normal.        Behavior: Behavior normal.      Consultants:  General surgery  Procedures:    Data Reviewed: Results for orders placed or performed during the hospital encounter of 10/30/22 (from the past 24 hour(s))  Glucose, capillary     Status: Abnormal   Collection Time: 10/31/22  2:01 PM  Result Value Ref Range   Glucose-Capillary 182 (H) 70 - 99 mg/dL   Comment 1 Notify RN    Comment 2 Document in Chart   Glucose, capillary     Status: Abnormal   Collection Time: 10/31/22  5:05 PM  Result Value Ref Range   Glucose-Capillary 125 (H) 70 - 99 mg/dL  Glucose, capillary     Status: Abnormal   Collection Time: 10/31/22 10:23 PM  Result Value Ref Range   Glucose-Capillary 158 (H) 70 - 99 mg/dL  CBC with Differential/Platelet     Status: Abnormal   Collection Time: 11/01/22  3:37 AM  Result Value Ref Range   WBC 5.5 4.0 - 10.5 K/uL   RBC 4.00 (L) 4.22 - 5.81 MIL/uL   Hemoglobin 12.2 (L) 13.0 - 17.0 g/dL   HCT 29.5 (L) 62.1 - 30.8 %   MCV 88.8 80.0 - 100.0 fL   MCH 30.5 26.0 - 34.0 pg   MCHC 34.4 30.0 - 36.0 g/dL   RDW 65.7 84.6 - 96.2 %   Platelets 280 150 - 400 K/uL   nRBC 0.0 0.0 - 0.2 %   Neutrophils Relative % 53 %   Neutro Abs 3.0 1.7 - 7.7 K/uL   Lymphocytes Relative 34 %   Lymphs Abs 1.9 0.7 - 4.0 K/uL   Monocytes Relative 10 %   Monocytes Absolute 0.5 0.1 - 1.0 K/uL   Eosinophils Relative  1 %   Eosinophils Absolute 0.0 0.0 - 0.5 K/uL   Basophils Relative 1 %   Basophils Absolute 0.0 0.0 - 0.1 K/uL   Immature Granulocytes 1 %   Abs Immature Granulocytes 0.03 0.00 - 0.07 K/uL  Basic metabolic panel     Status: Abnormal   Collection Time: 11/01/22  3:37 AM  Result Value Ref Range   Sodium 133 (L) 135 - 145 mmol/L   Potassium 3.8 3.5 - 5.1 mmol/L   Chloride 100 98 - 111 mmol/L   CO2 24 22 - 32 mmol/L   Glucose, Bld 140 (H) 70 - 99 mg/dL   BUN 5 (L) 6 - 20 mg/dL   Creatinine, Ser  0.56 (L) 0.61 - 1.24 mg/dL   Calcium 8.8 (L) 8.9 - 10.3 mg/dL   GFR, Estimated >16 >10 mL/min   Anion gap 9 5 - 15  Magnesium     Status: None   Collection Time: 11/01/22  3:37 AM  Result Value Ref Range   Magnesium 1.7 1.7 - 2.4 mg/dL  Glucose, capillary     Status: Abnormal   Collection Time: 11/01/22  8:00 AM  Result Value Ref Range   Glucose-Capillary 113 (H) 70 - 99 mg/dL  Glucose, capillary     Status: Abnormal   Collection Time: 11/01/22 11:47 AM  Result Value Ref Range   Glucose-Capillary 184 (H) 70 - 99 mg/dL    I have reviewed pertinent nursing notes, vitals, labs, and images as necessary. I have ordered labwork to follow up on as indicated.  I have reviewed the last notes from staff over past 24 hours. I have discussed patient's care plan and test results with nursing staff, CM/SW, and other staff as appropriate.  Time spent: Greater than 50% of the 55 minute visit was spent in counseling/coordination of care for the patient as laid out in the A&P.   LOS: 2 days   Lewie Chamber, MD [facility] 11/01/2022, 1:44 PM

## 2022-11-01 NOTE — Progress Notes (Signed)
Girguis instructed RN to perform Penicillin challenge. Patient passed penicillin challenge with no complications. Only slight increase in BP at times. No rash/hives seen or reported at this time.

## 2022-11-01 NOTE — Assessment & Plan Note (Addendum)
-   childhood allergy to PCN; possible rash reaction - patient amenable to challenge testing; performed 9/15 with amoxicillin dose - no reactions after dose

## 2022-11-01 NOTE — Discharge Summary (Signed)
Physician Discharge Summary   Ricardo Luna. MWU:[id number] DOB: August 22, 1965 DOA: 10/30/2022  PCP: Junie Spencer, FNP  Admit date: 10/30/2022 Discharge date: 11/01/2022  Admitted From: Home Disposition:  Home Discharging physician: Lewie Chamber, MD Barriers to discharge: none  Recommendations at discharge: Follow up with GI for colonoscopy Follow up with surgery after colonoscopy to discuss elective resection Passed amoxicillin challenge prior to discharge  Discharge Condition: stable CODE STATUS: Full Diet recommendation:  Diet Orders (From admission, onward)     Start     Ordered   11/01/22 1019  DIET SOFT Room service appropriate? Yes; Fluid consistency: Thin  Diet effective now       Question Answer Comment  Room service appropriate? Yes   Fluid consistency: Thin      11/01/22 1018   11/01/22 0000  Diet general        11/01/22 1504            Hospital Course: Ricardo Luna is a 57 year old male with PMH recurrent diverticulitis, DM II, HTN, HLD who presented with right lower quadrant abdominal pain.  He had recently been on outpatient antibiotic course and after finishing antibiotics he began feeling bad after approximately 2 days.  He therefore presented for further evaluation. CT abdomen/pelvis was obtained which showed diverticulitis involving right lower quadrant with findings suspicious for developing intramural abscess but no drainable fluid collection. He was started on ciprofloxacin and Flagyl and admitted for further workup.  General surgery was consulted on admission. He had clinical improvement on IV antibiotics.  Diet was slowly advanced which he tolerated. He was also able to undergo amoxicillin challenge test with no reaction and tolerated well.  In the future he can be considered for use of Zosyn or Augmentin.  He will follow-up outpatient with general surgery for discussion of elective resection.  He will need referral to GI for colonoscopy after 4  to 6 weeks. Extended course of ciprofloxacin/Flagyl continued at discharge.  Assessment and Plan: * Diverticulitis - CT A/P on admission notes right lower quadrant diverticulitis with some concern for intramural abscess. Per report: " Long segment mural thickening of the sigmoid colon with ill-defined hypodensity involving the sigmoid colon in the right lower quadrant. Findings are suspicious for acute diverticulitis with developing intramural abscess. No drainable fluid collection." - appreciate general surgery assistance; outpatient followup to discuss elective resection - tolerated diet advancement and stable for d/c home - continue cipro/flagyl x 10 more day - outpatient followup with GI for colonoscopy   Encounter for allergy testing - childhood allergy to PCN; possible rash reaction - patient amenable to challenge testing; performed 9/15 with amoxicillin dose - no reactions after dose  Mixed hyperlipidemia - Continue Lipitor  Gastroesophageal reflux disease -Continue PPI  Type 2 diabetes mellitus with hyperglycemia, without long-term current use of insulin (HCC) - On Janumet at home  Essential hypertension - resume home regimen    The patient's acute and chronic medical conditions were treated accordingly. On day of discharge, patient was felt deemed stable for discharge. Patient/family member advised to call PCP or come back to ER if needed.   Principal Diagnosis: Diverticulitis  Discharge Diagnoses: Active Hospital Problems   Diagnosis Date Noted   Diverticulitis 10/30/2022    Priority: 1.   Encounter for allergy testing 11/01/2022    Priority: 2.   Type 2 diabetes mellitus with hyperglycemia, without long-term current use of insulin (HCC) 06/03/2020   Essential hypertension 06/03/2020   Gastroesophageal reflux disease 06/03/2020  Mixed hyperlipidemia 06/03/2020    Resolved Hospital Problems  No resolved problems to display.     Discharge Instructions      Diet general   Complete by: As directed    Increase activity slowly   Complete by: As directed       Allergies as of 11/01/2022       Reactions   Penicillins Other (See Comments)   Successful amoxicillin challenge 11/01/22. Would trial a course under monitoring for first trial.        Medication List     STOP taking these medications    ketoconazole 2 % cream Commonly known as: NIZORAL       TAKE these medications    Accu-Chek Guide test strip Generic drug: glucose blood USE 1 STRIP TO CHECK GLUCOSE UP TO 4 TIMES DAILY AS DIRECTED   Accu-Chek Softclix Lancets lancets SMARTSIG:Topical 1-4 Times Daily   aspirin EC 81 MG tablet Take 81 mg by mouth daily. Swallow whole.   atorvastatin 10 MG tablet Commonly known as: LIPITOR Take 1 tablet (10 mg total) by mouth daily.   ciprofloxacin 500 MG tablet Commonly known as: Cipro Take 1 tablet (500 mg total) by mouth 2 (two) times daily for 10 days.   gabapentin 100 MG capsule Commonly known as: NEURONTIN Take 1-3 capsules (100-300 mg total) by mouth daily as needed.   hydrochlorothiazide 50 MG tablet Commonly known as: HYDRODIURIL Take 1 tablet (50 mg total) by mouth daily.   Janumet XR 50-1000 MG Tb24 Generic drug: SitaGLIPtin-MetFORMIN HCl Take 1 tablet by mouth 2 (two) times daily.   lisinopril 20 MG tablet Commonly known as: ZESTRIL Take 1 tablet (20 mg total) by mouth daily.   metroNIDAZOLE 500 MG tablet Commonly known as: Flagyl Take 1 tablet (500 mg total) by mouth 3 (three) times daily for 10 days.   omeprazole 20 MG capsule Commonly known as: PRILOSEC Take 1 capsule (20 mg total) by mouth daily.        Follow-up Information     Romie Levee, MD. Schedule an appointment as soon as possible for a visit.   Specialties: General Surgery, Colon and Rectal Surgery Why: once colonoscopy completed Contact information: [address] [place] [address] [phone]          Junie Spencer, FNP. Schedule an appointment as soon as possible for a visit in 1 week(s).   Specialty: Family Medicine Contact information: [address] [place] [address] [phone]                Allergies  Allergen Reactions   Penicillins Other (See Comments)    Successful amoxicillin challenge 11/01/22. Would trial a course under monitoring for first trial.    Consultations: General surgery  Procedures:   Discharge Exam: BP (!) 144/75 (BP Location: Left Arm)   Pulse 62   Temp 98.4 F (36.9 C) (Temporal)   Resp 20   Ht 6' (1.829 m)   Wt 97.5 kg   SpO2 98%   BMI 29.16 kg/m  Physical Exam Constitutional:      General: He is not in acute distress.    Appearance: Normal appearance.  HENT:     Head: Normocephalic and atraumatic.     Mouth/Throat:     Mouth: Mucous membranes are moist.  Eyes:     Extraocular Movements: Extraocular movements intact.  Cardiovascular:     Rate and Rhythm: Normal rate and regular rhythm.  Pulmonary:  Effort: Pulmonary effort is normal. No respiratory distress.     Breath sounds: Normal breath sounds. No wheezing.  Abdominal:     General: Bowel sounds are normal. There is no distension.     Palpations: Abdomen is soft.     Tenderness: There is abdominal tenderness (mild) in the right lower quadrant. There is no guarding or rebound.  Musculoskeletal:        General: Normal range of motion.     Cervical back: Normal range of motion and neck supple.  Skin:    General: Skin is warm and dry.  Neurological:     General: No focal deficit present.     Mental Status: He is alert.  Psychiatric:        Mood and Affect: Mood normal.        Behavior: Behavior normal.      The results of significant diagnostics from this hospitalization (including imaging, microbiology, ancillary and laboratory) are listed below for reference.   Microbiology: No results found for this or any previous visit (from the past 240  hour(s)).   Labs: BNP (last 3 results) No results for input(s): "BNP" in the last 8760 hours. Basic Metabolic Panel: Recent Labs  Lab 10/30/22 1205 10/31/22 0410 11/01/22 0337  NA 130* 132* 133*  K 4.0 3.7 3.8  CL 93* 98 100  CO2 27 26 24   GLUCOSE 244* 145* 140*  BUN 8 7 5*  CREATININE 0.68 0.80 0.56*  CALCIUM 9.4 8.5* 8.8*  MG  --   --  1.7   Liver Function Tests: Recent Labs  Lab 10/30/22 1205  AST 20  ALT 28  ALKPHOS 51  BILITOT 0.5  PROT 7.5  ALBUMIN 4.2   Recent Labs  Lab 10/30/22 1205  LIPASE 24   No results for input(s): "AMMONIA" in the last 168 hours. CBC: Recent Labs  Lab 10/30/22 1205 10/31/22 0410 11/01/22 0337  WBC 9.3 7.0 5.5  NEUTROABS  --   --  3.0  HGB 13.7 11.9* 12.2*  HCT 38.6* 34.7* 35.5*  MCV 87.3 90.1 88.8  PLT 335 284 280   Cardiac Enzymes: No results for input(s): "CKTOTAL", "CKMB", "CKMBINDEX", "TROPONINI" in the last 168 hours. BNP: Invalid input(s): "POCBNP" CBG: Recent Labs  Lab 10/31/22 1401 10/31/22 1705 10/31/22 2223 11/01/22 0800 11/01/22 1147  GLUCAP 182* 125* 158* 113* 184*   D-Dimer No results for input(s): "DDIMER" in the last 72 hours. Hgb A1c No results for input(s): "HGBA1C" in the last 72 hours. Lipid Profile No results for input(s): "CHOL", "HDL", "LDLCALC", "TRIG", "CHOLHDL", "LDLDIRECT" in the last 72 hours. Thyroid function studies No results for input(s): "TSH", "T4TOTAL", "T3FREE", "THYROIDAB" in the last 72 hours.  Invalid input(s): "FREET3" Anemia work up No results for input(s): "VITAMINB12", "FOLATE", "FERRITIN", "TIBC", "IRON", "RETICCTPCT" in the last 72 hours. Urinalysis    Component Value Date/Time   COLORURINE STRAW (A) 10/30/2022 1205   APPEARANCEUR CLEAR 10/30/2022 1205   LABSPEC 1.011 10/30/2022 1205   PHURINE 7.0 10/30/2022 1205   GLUCOSEU 150 (A) 10/30/2022 1205   HGBUR NEGATIVE 10/30/2022 1205   BILIRUBINUR NEGATIVE 10/30/2022 1205   KETONESUR NEGATIVE 10/30/2022 1205    PROTEINUR NEGATIVE 10/30/2022 1205   NITRITE NEGATIVE 10/30/2022 1205   LEUKOCYTESUR NEGATIVE 10/30/2022 1205   Sepsis Labs Recent Labs  Lab 10/30/22 1205 10/31/22 0410 11/01/22 0337  WBC 9.3 7.0 5.5   Microbiology No results found for this or any previous visit (from the past 240 hour(s)).  Procedures/Studies:  CT ABDOMEN PELVIS W CONTRAST  Result Date: 10/30/2022 CLINICAL DATA:  Recent diverticulitis, taking antibiotics. One day history of worsening right lower quadrant abdominal pain radiating into the groin. EXAM: CT ABDOMEN AND PELVIS WITH CONTRAST TECHNIQUE: Multidetector CT imaging of the abdomen and pelvis was performed using the standard protocol following bolus administration of intravenous contrast. RADIATION DOSE REDUCTION: This exam was performed according to the departmental dose-optimization program which includes automated exposure control, adjustment of the mA and/or kV according to patient size and/or use of iterative reconstruction technique. CONTRAST:  OMNIPAQUE IOHEXOL 300 MG/ML  SOLN COMPARISON:  None Available. FINDINGS: Lower chest: No focal consolidation or pulmonary nodule in the lung bases. No pleural effusion or pneumothorax demonstrated. Partially imaged heart size is normal. Coronary artery calcifications. Hepatobiliary: No focal hepatic lesions. No intra or extrahepatic biliary ductal dilation. Underdistended gallbladder. Pancreas: No focal lesions or main ductal dilation. Spleen: Normal in size without focal abnormality. Adrenals/Urinary Tract: No adrenal nodules. No suspicious renal mass, calculi or hydronephrosis. No focal bladder wall thickening. Stomach/Bowel: Normal appearance of the stomach. Diffuse colonic diverticula. Long segment mural thickening of the sigmoid colon. Ill-defined hypodensity involving the sigmoid colon in the right lower quadrant measures 2.1 x 2.1 cm (2:73). Normal appendix. Vascular/Lymphatic: Aortic atherosclerosis. 1.1 cm  peripancreatic lymph node (2:24). Reproductive: Prostate is unremarkable. Other: No free fluid, fluid collection, or free air. Peritoneal thickening and enhancement along the inferior abdomen. Musculoskeletal: No acute or abnormal lytic or blastic osseous lesions. Multilevel degenerative changes of the partially imaged thoracic and lumbar spine. Small fat-containing left inguinal hernia. IMPRESSION: 1. Long segment mural thickening of the sigmoid colon with ill-defined hypodensity involving the sigmoid colon in the right lower quadrant. Findings are suspicious for acute diverticulitis with developing intramural abscess. No drainable fluid collection. 2. Peritoneal thickening and enhancement along the inferior abdomen, likely reactive. 3. A 1.1 cm peripancreatic lymph node is nonspecific. 4. Aortic Atherosclerosis (ICD10-I70.0). Coronary artery calcifications. Assessment for potential risk factor modification, dietary therapy or pharmacologic therapy may be warranted, if clinically indicated. Electronically Signed   By: Agustin Cree M.D.   On: 10/30/2022 17:01     Time coordinating discharge: Over 30 minutes    Lewie Chamber, MD  [facility] 11/01/2022, 3:11 PM

## 2022-11-02 ENCOUNTER — Telehealth: Payer: Self-pay

## 2022-11-02 LAB — HEMOGLOBIN A1C
Hgb A1c MFr Bld: 8.2 % — ABNORMAL HIGH (ref 4.8–5.6)
Mean Plasma Glucose: 189 mg/dL

## 2022-11-02 NOTE — Transitions of Care (Post Inpatient/ED Visit) (Signed)
11/02/2022  Name: Ricardo Luna. MRN: [id number] DOB: [birth year]  Today's TOC FU Call Status: Today's TOC FU Call Status:: Successful TOC FU Call Completed TOC FU Call Complete Date: 11/01/22 Patient's Name and Date of Birth confirmed.  Transition Care Management Follow-up Telephone Call Date of Discharge: 11/01/22 Discharge Facility: [facility]) Type of Discharge: Inpatient Admission Primary Inpatient Discharge Diagnosis:: diverticulitis How have you been since you were released from the hospital?: Better Any questions or concerns?: No  Items Reviewed: Did you receive and understand the discharge instructions provided?: Yes Medications obtained,verified, and reconciled?: Yes (Medications Reviewed) Any new allergies since your discharge?: No Dietary orders reviewed?: Yes Do you have support at home?: No  Medications Reviewed Today: Medications Reviewed Today     Reviewed by Karena Addison, LPN (Licensed Practical Nurse) on 11/02/22 at 1536  Med List Status: <None>   Medication Order Taking? Sig Documenting Provider Last Dose Status Informant  ACCU-CHEK GUIDE test strip [id number] No USE 1 STRIP TO CHECK GLUCOSE UP TO 4 TIMES DAILY AS DIRECTED [provider] Taking Active Self, Spouse/Significant Other  Accu-Chek Softclix Lancets lancets [id number] No SMARTSIG:Topical 1-4 Times Daily [provider] Taking Active Self, Spouse/Significant Other  aspirin EC 81 MG tablet [id number] No Take 81 mg by mouth daily. Swallow whole. [provider] 10/30/2022 Active Self, Spouse/Significant Other  atorvastatin (LIPITOR) 10 MG tablet [id number] No Take 1 tablet (10 mg total) by mouth daily. Junie Spencer, [place] 10/30/2022 Active Self, Spouse/Significant Other  ciprofloxacin (CIPRO) 500 MG tablet [id number]  Take 1 tablet (500 mg total) by mouth 2 (two) times daily for 10 days. Lewie Chamber, MD  Active   gabapentin (NEURONTIN) 100 MG capsule [id number] No  Take 1-3 capsules (100-300 mg total) by mouth daily as needed. Junie Spencer, [place] 10/29/2022 Active Self, Spouse/Significant Other  hydrochlorothiazide (HYDRODIURIL) 50 MG tablet [id number] No Take 1 tablet (50 mg total) by mouth daily. Junie Spencer, [place] 10/30/2022 Active Self, Spouse/Significant Other  lisinopril (ZESTRIL) 20 MG tablet [id number] No Take 1 tablet (20 mg total) by mouth daily. Junie Spencer, [place] 10/30/2022 Active Self, Spouse/Significant Other  metroNIDAZOLE (FLAGYL) 500 MG tablet [id number]  Take 1 tablet (500 mg total) by mouth 3 (three) times daily for 10 days. Lewie Chamber, MD  Active   omeprazole (PRILOSEC) 20 MG capsule [id number] No Take 1 capsule (20 mg total) by mouth daily. Junie Spencer, [place] 10/29/2022 Active Self, Spouse/Significant Other  SitaGLIPtin-MetFORMIN HCl (JANUMET XR) 50-1000 MG TB24 [id number] No Take 1 tablet by mouth 2 (two) times daily. Junie Spencer, FNP 10/30/2022 Active Self, Spouse/Significant Other            Home Care and Equipment/Supplies: Were Home Health Services Ordered?: NA Any new equipment or medical supplies ordered?: NA  Functional Questionnaire: Do you need assistance with bathing/showering or dressing?: No Do you need assistance with meal preparation?: No Do you need assistance with eating?: No Do you have difficulty maintaining continence: No Do you need assistance with getting out of bed/getting out of a chair/moving?: No Do you have difficulty managing or taking your medications?: No  Follow up appointments reviewed: PCP Follow-up appointment confirmed?: Yes Date of PCP follow-up appointment?: 11/16/22 Follow-up Provider: [facility] Follow-up appointment confirmed?: NA Do you need transportation to your follow-up appointment?: No Do you understand care options if your condition(s) worsen?: Yes-patient verbalized understanding    SIGNATURE Karena Addison, LPN [facility] Nurse Health Advisor Direct  Dial [phone]

## 2022-11-16 ENCOUNTER — Ambulatory Visit: Payer: BC Managed Care – PPO | Admitting: Family

## 2022-11-16 ENCOUNTER — Encounter: Payer: Self-pay | Admitting: Family

## 2022-11-16 VITALS — BP 108/72 | HR 79 | Temp 97.7°F | Ht 72.0 in | Wt 212.8 lb

## 2022-11-16 DIAGNOSIS — Z09 Encounter for follow-up examination after completed treatment for conditions other than malignant neoplasm: Secondary | ICD-10-CM

## 2022-11-16 DIAGNOSIS — F17200 Nicotine dependence, unspecified, uncomplicated: Secondary | ICD-10-CM | POA: Diagnosis not present

## 2022-11-16 DIAGNOSIS — Z8719 Personal history of other diseases of the digestive system: Secondary | ICD-10-CM

## 2022-11-16 DIAGNOSIS — I10 Essential (primary) hypertension: Secondary | ICD-10-CM | POA: Diagnosis not present

## 2022-11-16 NOTE — Progress Notes (Signed)
Subjective:    Patient ID: Ricardo Luna., male    DOB: 1965-05-30, 57 y.o.   MRN: [id number]  Chief Complaint  Patient presents with   Transitions Of Care   Today's visit was for Transitional Care Management.  The patient was discharged from [facility] on 11/01/22 with a primary diagnosis of diverticulitis.   Contact with the patient and/or caregiver, by a clinical staff member, was made on 11/02/22 and was documented as a telephone encounter within the EMR.  Through chart review and discussion with the patient I have determined that management of their condition is of moderate complexity.    He went to the ED on 10/30/22 with LLQ abdominal pain. He had completed cipro and flagy. He had clinical improvement on IV antibiotics.   "He was also able to undergo amoxicillin challenge test with no reaction and tolerated well.  In the future he can be considered for use of Zosyn or Augmentin.  He will follow-up outpatient with general surgery for discussion of elective resection.  He will need referral to GI for colonoscopy after 4 to 6 weeks. Extended course of ciprofloxacin/Flagyl continued at discharge."  Since being discharged from the hospital he has stopped his hydrochlorothiazide 50 mg and Lisinopril 20 mg. His BP is at goal today. He has lost 15 lbs over the last few months being on liquid diet.   Abdominal Pain This is a recurrent problem. The problem has been resolved. The patient is experiencing no pain. The quality of the pain is aching. Pertinent negatives include no diarrhea, dysuria, fever, flatus, frequency, hematochezia, hematuria, nausea or vomiting.  Nicotine Dependence Presents for follow-up visit. His urge triggers include company of smokers. The symptoms have been stable. He smokes 1 pack of cigarettes per day.      Review of Systems  Constitutional:  Negative for fever.  Gastrointestinal:  Positive for abdominal pain. Negative for diarrhea, flatus,  hematochezia, nausea and vomiting.  Genitourinary:  Negative for dysuria, frequency and hematuria.  All other systems reviewed and are negative.  Family History  Problem Relation Age of Onset   Hypertension Mother    Diabetes Mother    Stomach cancer Mother    Diabetes Father    Hypertension Father    Thyroid cancer Daughter    Social History   Socioeconomic History   Marital status: Significant Other    Spouse name: Not on file   Number of children: Not on file   Years of education: Not on file   Highest education level: Not on file  Occupational History   Not on file  Tobacco Use   Smoking status: Every Day    Current packs/day: 1.00    Types: Cigarettes   Smokeless tobacco: Never  Vaping Use   Vaping status: Never Used  Substance and Sexual Activity   Alcohol use: Yes    Comment: every other day, 2-3 beers at a time   Drug use: No   Sexual activity: Yes    Birth control/protection: None  Other Topics Concern   Not on file  Social History Narrative   Not on file   Social Determinants of Health   Financial Resource Strain: Not on file  Food Insecurity: No Food Insecurity (10/30/2022)   Hunger Vital Sign    Worried About Running Out of Food in the Last Year: Never true    Ran Out of Food in the Last Year: Never true  Transportation Needs: No Transportation Needs (10/30/2022)  PRAPARE - Administrator, Civil Service (Medical): No    Lack of Transportation (Non-Medical): No  Physical Activity: Not on file  Stress: Not on file  Social Connections: Not on file        Objective:   Physical Exam Vitals reviewed.  Constitutional:      General: He is not in acute distress.    Appearance: He is well-developed.  HENT:     Head: Normocephalic.     Right Ear: Tympanic membrane normal.     Left Ear: Tympanic membrane normal.  Eyes:     General:        Right eye: No discharge.        Left eye: No discharge.     Pupils: Pupils are equal, round, and  reactive to light.  Neck:     Thyroid: No thyromegaly.  Cardiovascular:     Rate and Rhythm: Normal rate and regular rhythm.     Heart sounds: Normal heart sounds. No murmur heard. Pulmonary:     Effort: Pulmonary effort is normal. No respiratory distress.     Breath sounds: Normal breath sounds. No wheezing.  Abdominal:     General: Bowel sounds are normal. There is no distension.     Palpations: Abdomen is soft.     Tenderness: There is no abdominal tenderness.  Musculoskeletal:        General: No tenderness. Normal range of motion.     Cervical back: Normal range of motion and neck supple.  Skin:    General: Skin is warm and dry.     Findings: No erythema or rash.  Neurological:     Mental Status: He is alert and oriented to person, place, and time.     Cranial Nerves: No cranial nerve deficit.     Deep Tendon Reflexes: Reflexes are normal and symmetric.  Psychiatric:        Behavior: Behavior normal.        Thought Content: Thought content normal.        Judgment: Judgment normal.   BP 108/72   Pulse 79   Temp 97.7 F (36.5 C) (Temporal)   Ht 6' (1.829 m)   Wt 212 lb 12.8 oz (96.5 kg)   SpO2 95%   BMI 28.86 kg/m       Assessment & Plan:  Ricardo Luna. comes in today with chief complaint of Transitions Of Care   Diagnosis and orders addressed:  1. History of diverticulitis - Ambulatory referral to Gastroenterology - BMP8+EGFR - CBC with Differential/Platelet  2. Hospital discharge follow-up - Ambulatory referral to Gastroenterology - BMP8+EGFR - CBC with Differential/Platelet  3. Current smoker Smoking cessation discussed  4. Essential hypertension Resolved, continue to hold Lisinopril and HCTZ   Labs pending Health Maintenance reviewed Diet and exercise encouraged  Follow up plan: Keep chronic follow up  Jannifer Rodney, FNP

## 2022-11-16 NOTE — Patient Instructions (Signed)
Diverticulitis  Diverticulitis happens when poop (stool) and bacteria get trapped in small pouches in the colon called diverticula. These pouches may form if you have a condition called diverticulosis. When the poop and bacteria get trapped, it can cause an infection and inflammation. Diverticulitis may cause severe stomach pain and diarrhea. It can also lead to tissue damage in your colon. This can cause bleeding or blockage. In some cases, the diverticula may burst (rupture). This can cause infected poop to go into other parts of your abdomen. What are the causes? This condition is caused by poop getting trapped in the diverticula. This allows bacteria to grow. It can lead to inflammation and infection. What increases the risk? You are more likely to get this condition if you have diverticulosis. You are also more at risk if: You are overweight or obese. You do not get enough exercise. You drink alcohol. You smoke. You eat a lot of red meat, such as beef, pork, or lamb. You do not get enough fiber. Foods high in fiber include fruits, vegetables, beans, nuts, and whole grains. You are over 59 years of age. What are the signs or symptoms? Symptoms of this condition may include: Pain and tenderness in the abdomen. This pain is often felt on the left side but may occur in other spots. Fever and chills. Nausea and vomiting. Cramping. Bloating. Changes in how often you poop. Blood in your poop. How is this diagnosed? This condition is diagnosed based on your medical history and a physical exam. You may also have tests done to make sure there is nothing else causing your condition. These tests may include: Blood tests. Tests done on your pee (urine). A CT scan of the abdomen. You may need to have a colonoscopy. This is an exam to look at your whole large intestine. During the exam, a tube is put into the opening of your butt (anus) and then moved into your rectum, colon, and other parts of  the large intestine. This exam is done to look at the diverticula. It can also see if there is something else that may be causing your symptoms. How is this treated? Most cases are mild and can be treated at home. You may be told to: Take over-the-counter pain medicine. Only eat and drink clear liquids. Take antibiotics. Rest. More severe cases may need to be treated at a hospital. Treatment may include: Not eating or drinking. Taking pain medicines. Getting antibiotics through an IV. Getting fluids and nutrition through an IV. Surgery. Follow these instructions at home: Medicines Take over-the-counter and prescription medicines only as told by your health care provider. These include fiber supplements, probiotics, and medicines to soften your poop (stool softeners). If you were prescribed antibiotics, take them as told by your provider. Do not stop using the antibiotic even if you start to feel better. Ask your provider if the medicine prescribed to you requires you to avoid driving or using machinery. Eating and drinking  Follow the diet told by your provider. You may need to only eat and drink liquids. After your symptoms get better, you may be able to return to a more normal diet. You may be told to eat at least 25 grams (25 g) of fiber each day. Fiber makes it easier to poop. Healthy sources of fiber include: Berries. One cup has 4-8 g of fiber. Beans or lentils. One-half cup has 5-8 g of fiber. Green vegetables. One cup has 4 g of fiber. Avoid eating red meat.  General instructions Do not use any products that contain nicotine or tobacco. These products include cigarettes, chewing tobacco, and vaping devices, such as e-cigarettes. If you need help quitting, ask your provider. Exercise for at least 30 minutes, 3 times a week. Exercise hard enough to raise your heart rate and break a sweat. Contact a health care provider if: Your pain gets worse. Your pooping does not go back to  normal. Your symptoms do not get better with treatment. Your symptoms get worse all of a sudden. You have a fever. You vomit more than one time. Your poop is bloody, black, or tarry. This information is not intended to replace advice given to you by your health care provider. Make sure you discuss any questions you have with your health care provider. Document Revised: 10/30/2021 Document Reviewed: 10/30/2021 Elsevier Patient Education  2024 [employer].

## 2022-11-17 LAB — CBC WITH DIFFERENTIAL/PLATELET
Basophils Absolute: 0.1 10*3/uL (ref 0.0–0.2)
Basos: 1 %
EOS (ABSOLUTE): 0.4 10*3/uL (ref 0.0–0.4)
Eos: 5 %
Hematocrit: 40.8 % (ref 37.5–51.0)
Hemoglobin: 13.4 g/dL (ref 13.0–17.7)
Immature Grans (Abs): 0 10*3/uL (ref 0.0–0.1)
Immature Granulocytes: 0 %
Lymphocytes Absolute: 3.6 10*3/uL — ABNORMAL HIGH (ref 0.7–3.1)
Lymphs: 41 %
MCH: 30.4 pg (ref 26.6–33.0)
MCHC: 32.8 g/dL (ref 31.5–35.7)
MCV: 93 fL (ref 79–97)
Monocytes Absolute: 0.8 10*3/uL (ref 0.1–0.9)
Monocytes: 9 %
Neutrophils Absolute: 4 10*3/uL (ref 1.4–7.0)
Neutrophils: 44 %
Platelets: 343 10*3/uL (ref 150–450)
RBC: 4.41 x10E6/uL (ref 4.14–5.80)
RDW: 12.7 % (ref 11.6–15.4)
WBC: 8.9 10*3/uL (ref 3.4–10.8)

## 2022-11-17 LAB — BMP8+EGFR
BUN/Creatinine Ratio: 13 (ref 9–20)
BUN: 9 mg/dL (ref 6–24)
CO2: 25 mmol/L (ref 20–29)
Calcium: 10.3 mg/dL — ABNORMAL HIGH (ref 8.7–10.2)
Chloride: 98 mmol/L (ref 96–106)
Creatinine, Ser: 0.7 mg/dL — ABNORMAL LOW (ref 0.76–1.27)
Glucose: 156 mg/dL — ABNORMAL HIGH (ref 70–99)
Potassium: 4.2 mmol/L (ref 3.5–5.2)
Sodium: 138 mmol/L (ref 134–144)
eGFR: 107 mL/min/{1.73_m2} (ref >59)

## 2022-11-30 ENCOUNTER — Telehealth: Payer: Self-pay

## 2022-11-30 NOTE — Telephone Encounter (Signed)
Patient aware.

## 2022-11-30 NOTE — Telephone Encounter (Signed)
Patient has referral in place to see GI doctor.  They have been unable to get in touch with the office in [place] and would like instead to be referred to [facility] in [place].  More convenient for patient because he works in [place].  Patient prefers early morning appointment.  Please advise.

## 2022-11-30 NOTE — Telephone Encounter (Signed)
Referral has been re-directed as Patient requested.

## 2022-12-08 ENCOUNTER — Telehealth: Payer: Self-pay | Admitting: Gastroenterology

## 2022-12-08 DIAGNOSIS — R1032 Left lower quadrant pain: Secondary | ICD-10-CM

## 2022-12-08 DIAGNOSIS — K5792 Diverticulitis of intestine, part unspecified, without perforation or abscess without bleeding: Secondary | ICD-10-CM

## 2022-12-08 NOTE — Telephone Encounter (Signed)
Good afternoon Dr. Meridee Score,    Supervising Provider PM 12/08/22   We received a referral for patient for a hospital follow up and history of diverticulitis. Patient also stated he was advised at [facility] there may be a abscess on his intestine. Patient has previous history with [facility] and last had colonoscopy in 2017. Patient is wishing to transfer his care due to working in [place] and is a closer location for him to travel to. Patient's previous records are in [facility] for you to review and advise on scheduling.    Thank you.

## 2022-12-09 ENCOUNTER — Encounter: Payer: Self-pay | Admitting: Gastroenterology

## 2022-12-09 NOTE — Telephone Encounter (Signed)
Called patient to schedule. Patient had been scheduled for 12/20. Patient preferred an earlier afternoon time for colonoscopy.

## 2022-12-09 NOTE — Telephone Encounter (Signed)
Case reviewed. Imaging reviewed. Chart reviewed with what appears to be improvement of his symptomatology. I recommend the following for his workup:  1) He is accepted to the [facility] for his GI care/needs 2) He needs a CTAP with IV/PO contrast to be completed for his history of possible complicated diverticulitis to make sure it is safe for colonoscopy and no abscess has developed 3) He can be placed scheduled for Colonoscopy in the [facility] next few weeks as available (but CT needs to be completed prior to the procedure even a few days before is OK)  If PCP can help with ordering CT that is OK or my team.  Corliss Parish, MD [facility] Office # [phone]

## 2022-12-09 NOTE — Telephone Encounter (Signed)
Can one of you please set up colon in the [facility] sometime in November with previsit with GM.  Thank you so much

## 2022-12-09 NOTE — Telephone Encounter (Signed)
Neysa Bonito will you order the CT I will set up the colon in the [facility]. Thank you

## 2022-12-10 NOTE — Telephone Encounter (Signed)
CT abdomen ordered.   Jannifer Rodney, FNP

## 2022-12-10 NOTE — Addendum Note (Signed)
Addended by: Jannifer Rodney A on: 12/10/2022 02:12 PM   Modules accepted: Orders

## 2022-12-25 ENCOUNTER — Other Ambulatory Visit: Payer: Self-pay | Admitting: Family

## 2022-12-25 DIAGNOSIS — E1165 Type 2 diabetes mellitus with hyperglycemia: Secondary | ICD-10-CM

## 2022-12-25 DIAGNOSIS — E782 Mixed hyperlipidemia: Secondary | ICD-10-CM

## 2022-12-25 DIAGNOSIS — I10 Essential (primary) hypertension: Secondary | ICD-10-CM

## 2022-12-25 NOTE — Telephone Encounter (Signed)
Copied from CRM [id number]. Topic: Clinical - Medication Refill >> Dec 25, 2022  9:47 AM Raven B wrote: Most Recent Primary Care Visit:  Provider: Jannifer Rodney A  Department: [facility]  Visit Type: HOSPITAL FU  Date: 11/16/2022  Medication:   Has the patient contacted their pharmacy? Yes (Agent: If no, request that the patient contact the pharmacy for the refill. If patient does not wish to contact the pharmacy document the reason why and proceed with request.) (Agent: If yes, when and what did the pharmacy advise?) No refills left   Is this the correct pharmacy for this prescription?  If no, delete pharmacy and type the correct one.  This is the patient's preferred pharmacy:  [facility] [address], [place] - [address] [place] [address] [place] [address] Phone: [phone] Fax: [phone]   Has the prescription been filled recently?   Is the patient out of the medication?   Has the patient been seen for an appointment in the last year OR does the patient have an upcoming appointment?   Can we respond through MyChart? Yes  Agent: Please be advised that Rx refills may take up to 3 business days. We ask that you follow-up with your pharmacy.

## 2023-01-22 ENCOUNTER — Ambulatory Visit (AMBULATORY_SURGERY_CENTER): Payer: BC Managed Care – PPO

## 2023-01-22 ENCOUNTER — Encounter: Payer: Self-pay | Admitting: Gastroenterology

## 2023-01-22 VITALS — Ht 72.0 in | Wt 214.0 lb

## 2023-01-22 DIAGNOSIS — Z860100 Personal history of colon polyps, unspecified: Secondary | ICD-10-CM

## 2023-01-22 DIAGNOSIS — K5792 Diverticulitis of intestine, part unspecified, without perforation or abscess without bleeding: Secondary | ICD-10-CM

## 2023-01-22 NOTE — Progress Notes (Signed)

## 2023-01-24 ENCOUNTER — Other Ambulatory Visit: Payer: Self-pay | Admitting: Family

## 2023-01-24 DIAGNOSIS — E782 Mixed hyperlipidemia: Secondary | ICD-10-CM

## 2023-01-24 DIAGNOSIS — E1165 Type 2 diabetes mellitus with hyperglycemia: Secondary | ICD-10-CM

## 2023-01-24 DIAGNOSIS — I10 Essential (primary) hypertension: Secondary | ICD-10-CM

## 2023-01-25 ENCOUNTER — Telehealth: Payer: Self-pay | Admitting: Family

## 2023-01-25 ENCOUNTER — Encounter: Payer: Self-pay | Admitting: Family

## 2023-01-25 NOTE — Telephone Encounter (Signed)
Which medications?

## 2023-01-25 NOTE — Telephone Encounter (Signed)
Patient is needing a refill on his meds his appt is not until jan 2 he needs his meds before then

## 2023-01-25 NOTE — Telephone Encounter (Signed)
LMTCB to schedule appt Letter mailed 

## 2023-01-25 NOTE — Telephone Encounter (Signed)
Hawks pt NTBS 30-d given 12/25/22

## 2023-02-01 ENCOUNTER — Other Ambulatory Visit: Payer: Self-pay

## 2023-02-01 DIAGNOSIS — K219 Gastro-esophageal reflux disease without esophagitis: Secondary | ICD-10-CM

## 2023-02-01 DIAGNOSIS — I10 Essential (primary) hypertension: Secondary | ICD-10-CM

## 2023-02-01 DIAGNOSIS — E782 Mixed hyperlipidemia: Secondary | ICD-10-CM

## 2023-02-01 DIAGNOSIS — E1165 Type 2 diabetes mellitus with hyperglycemia: Secondary | ICD-10-CM

## 2023-02-01 MED ORDER — HYDROCHLOROTHIAZIDE 50 MG PO TABS
50.0000 mg | ORAL_TABLET | Freq: Every day | ORAL | 2 refills | Status: DC
Start: 2023-02-01 — End: 2023-02-18

## 2023-02-01 MED ORDER — OMEPRAZOLE 20 MG PO CPDR: 20.0000 mg | DELAYED_RELEASE_CAPSULE | Freq: Every day | ORAL | 0 refills | Status: DC

## 2023-02-01 MED ORDER — ATORVASTATIN CALCIUM 10 MG PO TABS
10.0000 mg | ORAL_TABLET | Freq: Every day | ORAL | 2 refills | Status: DC
Start: 2023-02-01 — End: 2023-05-10

## 2023-02-01 MED ORDER — JANUMET XR 50-1000 MG PO TB24
1.0000 | ORAL_TABLET | Freq: Two times a day (BID) | ORAL | 0 refills | Status: DC
Start: 2023-02-01 — End: 2023-02-18

## 2023-02-01 MED ORDER — LISINOPRIL 20 MG PO TABS: 20.0000 mg | ORAL_TABLET | Freq: Every day | ORAL | 2 refills | Status: DC

## 2023-02-05 ENCOUNTER — Encounter: Payer: Self-pay | Admitting: Internal Medicine

## 2023-02-05 ENCOUNTER — Ambulatory Visit: Payer: BC Managed Care – PPO | Admitting: Internal Medicine

## 2023-02-05 VITALS — BP 85/58 | HR 90 | Temp 98.0°F | Resp 11 | Ht 72.0 in | Wt 214.0 lb

## 2023-02-05 DIAGNOSIS — D123 Benign neoplasm of transverse colon: Secondary | ICD-10-CM | POA: Diagnosis not present

## 2023-02-05 DIAGNOSIS — Z8719 Personal history of other diseases of the digestive system: Secondary | ICD-10-CM

## 2023-02-05 DIAGNOSIS — D125 Benign neoplasm of sigmoid colon: Secondary | ICD-10-CM

## 2023-02-05 DIAGNOSIS — K635 Polyp of colon: Secondary | ICD-10-CM

## 2023-02-05 DIAGNOSIS — A630 Anogenital (venereal) warts: Secondary | ICD-10-CM | POA: Diagnosis not present

## 2023-02-05 DIAGNOSIS — K648 Other hemorrhoids: Secondary | ICD-10-CM

## 2023-02-05 DIAGNOSIS — D122 Benign neoplasm of ascending colon: Secondary | ICD-10-CM

## 2023-02-05 DIAGNOSIS — Z1211 Encounter for screening for malignant neoplasm of colon: Secondary | ICD-10-CM

## 2023-02-05 DIAGNOSIS — K5730 Diverticulosis of large intestine without perforation or abscess without bleeding: Secondary | ICD-10-CM | POA: Diagnosis not present

## 2023-02-05 DIAGNOSIS — Z860100 Personal history of colon polyps, unspecified: Secondary | ICD-10-CM

## 2023-02-05 MED ORDER — SODIUM CHLORIDE 0.9 % IV SOLN: 500.0000 mL | Freq: Once | INTRAVENOUS | Status: DC

## 2023-02-05 NOTE — Progress Notes (Signed)
Report given to PACU, vss 

## 2023-02-05 NOTE — Op Note (Addendum)
[facility] Patient Name: Ricardo Luna Procedure Date: 02/05/2023 1:22 PM MRN: [id number] Endoscopist: Particia Lather , , [id number] Age: 57 Referring MD:  Date of Birth: September 15, 1965 Gender: Male Account #: [id number] Procedure:                Colonoscopy Indications:              High risk colon cancer surveillance: Personal                            history of colonic polyps, Incidental - Follow-up                            of diverticulitis Medicines:                Monitored Anesthesia Care Procedure:                Pre-Anesthesia Assessment:                           - Prior to the procedure, a History and Physical                            was performed, and patient medications and                            allergies were reviewed. The patient's tolerance of                            previous anesthesia was also reviewed. The risks                            and benefits of the procedure and the sedation                            options and risks were discussed with the patient.                            All questions were answered, and informed consent                            was obtained. Prior Anticoagulants: The patient has                            taken no anticoagulant or antiplatelet agents. ASA                            Grade Assessment: II - A patient with mild systemic                            disease. After reviewing the risks and benefits,                            the patient was deemed in satisfactory condition to  undergo the procedure.                           After obtaining informed consent, the colonoscope                            was passed under direct vision. Throughout the                            procedure, the patient's blood pressure, pulse, and                            oxygen saturations were monitored continuously. The                            Olympus CF-HQ190L ([id number]) Colonoscope  was                            introduced through the anus and advanced to the the                            terminal ileum. The colonoscopy was performed                            without difficulty. The patient tolerated the                            procedure well. The quality of the bowel                            preparation was excellent. The terminal ileum,                            ileocecal valve, appendiceal orifice, and rectum                            were photographed. Scope In: 1:26:39 PM Scope Out: 1:43:48 PM Scope Withdrawal Time: 0 hours 12 minutes 9 seconds  Total Procedure Duration: 0 hours 17 minutes 9 seconds  Findings:                 The terminal ileum appeared normal.                           Three sessile polyps were found in the transverse                            colon and ascending colon. The polyps were 3 to 6                            mm in size. These polyps were removed with a cold                            snare. Resection and retrieval were complete.  Multiple diverticula were found in the sigmoid                            colon and descending colon.                           A 3 mm polyp was found in the sigmoid colon. The                            polyp was sessile. The polyp was removed with a                            cold snare. Resection and retrieval were complete.                           Non-bleeding internal hemorrhoids were found during                            retroflexion.                           Anorectal wart was noted on rectal exam. Complications:            No immediate complications. Estimated Blood Loss:     Estimated blood loss was minimal. Impression:               - The examined portion of the ileum was normal.                           - Three 3 to 6 mm polyps in the transverse colon                            and in the ascending colon, removed with a cold                             snare. Resected and retrieved.                           - Diverticulosis in the sigmoid colon and in the                            descending colon.                           - One 3 mm polyp in the sigmoid colon, removed with                            a cold snare. Resected and retrieved.                           - Non-bleeding internal hemorrhoids.                           - Anorectal wart. Recommendation:           - Discharge  patient to home (with escort).                           - Await pathology results.                           - Recommend seeing colorectal surgery for removal                            of anal wart and to discuss consideration of                            colectomy due to recurrent diverticulitis episodes.                           - The findings and recommendations were discussed                            with the patient. Dr Particia Lather "Alan Ripper" Leonides Schanz,  02/05/2023 1:53:19 PM

## 2023-02-05 NOTE — Patient Instructions (Addendum)
-   Await pathology results. - Recommend seeing colorectal surgery for removal of anal wart.  YOU HAD AN ENDOSCOPIC PROCEDURE TODAY AT THE  ENDOSCOPY CENTER:   Refer to the procedure report that was given to you for any specific questions about what was found during the examination.  If the procedure report does not answer your questions, please call your gastroenterologist to clarify.  If you requested that your care partner not be given the details of your procedure findings, then the procedure report has been included in a sealed envelope for you to review at your convenience later.  YOU SHOULD EXPECT: Some feelings of bloating in the abdomen. Passage of more gas than usual.  Walking can help get rid of the air that was put into your GI tract during the procedure and reduce the bloating. If you had a lower endoscopy (such as a colonoscopy or flexible sigmoidoscopy) you may notice spotting of blood in your stool or on the toilet paper. If you underwent a bowel prep for your procedure, you may not have a normal bowel movement for a few days.  Please Note:  You might notice some irritation and congestion in your nose or some drainage.  This is from the oxygen used during your procedure.  There is no need for concern and it should clear up in a day or so.  SYMPTOMS TO REPORT IMMEDIATELY:  Following lower endoscopy (colonoscopy or flexible sigmoidoscopy):  Excessive amounts of blood in the stool  Significant tenderness or worsening of abdominal pains  Swelling of the abdomen that is new, acute  Fever of 100F or higher  For urgent or emergent issues, a gastroenterologist can be reached at any hour by calling [phone]. Do not use MyChart messaging for urgent concerns.    DIET:  We do recommend a small meal at first, but then you may proceed to your regular diet.  Drink plenty of fluids but you should avoid alcoholic beverages for 24 hours.  ACTIVITY:  You should plan to take it easy  for the rest of today and you should NOT DRIVE or use heavy machinery until tomorrow (because of the sedation medicines used during the test).    FOLLOW UP: Our staff will call the number listed on your records the next business day following your procedure.  We will call around 7:15- 8:00 am to check on you and address any questions or concerns that you may have regarding the information given to you following your procedure. If we do not reach you, we will leave a message.     If any biopsies were taken you will be contacted by phone or by letter within the next 1-3 weeks.  Please call us at [phone] if you have not heard about the biopsies in 3 weeks.    SIGNATURES/CONFIDENTIALITY: You and/or your care partner have signed paperwork which will be entered into your electronic medical record.  These signatures attest to the fact that that the information above on your After Visit Summary has been reviewed and is understood.  Full responsibility of the confidentiality of this discharge information lies with you and/or your care-partner.

## 2023-02-05 NOTE — Progress Notes (Signed)
Pt's states no medical or surgical changes since previsit or office visit. 

## 2023-02-05 NOTE — Progress Notes (Signed)
GASTROENTEROLOGY PROCEDURE H&P NOTE   Primary Care Physician: Junie Spencer, FNP    Reason for Procedure:   History of colon polyps, history of diverticulitis  Plan:    Colonoscopy  Patient is appropriate for endoscopic procedure(s) in the ambulatory ([facility]) setting.  The nature of the procedure, as well as the risks, benefits, and alternatives were carefully and thoroughly reviewed with the patient. Ample time for discussion and questions allowed. The patient understood, was satisfied, and agreed to proceed.     HPI: Ricardo Luna. is a 57 y.o. male who presents for colonoscopy for history of colon polyps and history of diverticulitis in 10/2022. He is no longer having any abdominal pain. Denies any blood in the stools. He has had several episodes of diverticulitis in the past.  Colonoscopy 10/25/15:   Past Medical History:  Diagnosis Date   Diverticulitis    Hypercholesteremia    Hypertension    Type 2 diabetes mellitus (HCC)     History reviewed. No pertinent surgical history.  Prior to Admission medications   Medication Sig Start Date End Date Taking? Authorizing Provider  ACCU-CHEK GUIDE test strip USE 1 STRIP TO CHECK GLUCOSE UP TO 4 TIMES DAILY AS DIRECTED 08/29/20  Yes [provider]  Accu-Chek Softclix Lancets lancets SMARTSIG:Topical 1-4 Times Daily 08/29/20  Yes [provider]  atorvastatin (LIPITOR) 10 MG tablet Take 1 tablet (10 mg total) by mouth daily. 02/01/23  Yes Hawks, Christy A, FNP  gabapentin (NEURONTIN) 100 MG capsule Take 1-3 capsules (100-300 mg total) by mouth daily as needed. 06/05/22  Yes Hawks, Christy A, FNP  hydrochlorothiazide (HYDRODIURIL) 50 MG tablet Take 1 tablet (50 mg total) by mouth daily. 02/01/23  Yes Hawks, Christy A, FNP  lisinopril (ZESTRIL) 20 MG tablet Take 1 tablet (20 mg total) by mouth daily. 02/01/23  Yes Hawks, Christy A, FNP  SitaGLIPtin-MetFORMIN HCl (JANUMET XR) 50-1000 MG TB24 Take 1 tablet by  mouth 2 (two) times daily. 02/01/23  Yes Hawks, Christy A, FNP  aspirin EC 81 MG tablet Take 81 mg by mouth daily. Swallow whole. Patient not taking: Reported on 01/22/2023    [provider]  omeprazole (PRILOSEC) 20 MG capsule Take 1 capsule (20 mg total) by mouth daily. 02/01/23   Junie Spencer, FNP    Current Outpatient Medications  Medication Sig Dispense Refill   ACCU-CHEK GUIDE test strip USE 1 STRIP TO CHECK GLUCOSE UP TO 4 TIMES DAILY AS DIRECTED     Accu-Chek Softclix Lancets lancets SMARTSIG:Topical 1-4 Times Daily     atorvastatin (LIPITOR) 10 MG tablet Take 1 tablet (10 mg total) by mouth daily. 30 tablet 2   gabapentin (NEURONTIN) 100 MG capsule Take 1-3 capsules (100-300 mg total) by mouth daily as needed. 90 capsule 0   hydrochlorothiazide (HYDRODIURIL) 50 MG tablet Take 1 tablet (50 mg total) by mouth daily. 30 tablet 2   lisinopril (ZESTRIL) 20 MG tablet Take 1 tablet (20 mg total) by mouth daily. 30 tablet 2   SitaGLIPtin-MetFORMIN HCl (JANUMET XR) 50-1000 MG TB24 Take 1 tablet by mouth 2 (two) times daily. 180 tablet 0   aspirin EC 81 MG tablet Take 81 mg by mouth daily. Swallow whole. (Patient not taking: Reported on 01/22/2023)     omeprazole (PRILOSEC) 20 MG capsule Take 1 capsule (20 mg total) by mouth daily. 90 capsule 0   Current Facility-Administered Medications  Medication Dose Route Frequency Provider Last Rate Last Admin   0.9 %  sodium chloride infusion  500 mL Intravenous Once Imogene Burn, MD        Allergies as of 02/05/2023 - Review Complete 02/05/2023  Allergen Reaction Noted   Penicillins Other (See Comments) 02/09/2011    Family History  Problem Relation Age of Onset   Hypertension Mother    Diabetes Mother    Stomach cancer Mother    Diabetes Father    Hypertension Father    Thyroid cancer Daughter    Colon cancer Neg Hx    Colon polyps Neg Hx    Esophageal cancer Neg Hx    Rectal cancer Neg Hx     Social History    Socioeconomic History   Marital status: Significant Other    Spouse name: Not on file   Number of children: Not on file   Years of education: Not on file   Highest education level: Not on file  Occupational History   Not on file  Tobacco Use   Smoking status: Every Day    Current packs/day: 1.00    Types: Cigarettes   Smokeless tobacco: Never  Vaping Use   Vaping status: Never Used  Substance and Sexual Activity   Alcohol use: Yes    Comment: every other day, 2-3 beers at a time   Drug use: No   Sexual activity: Yes    Birth control/protection: None  Other Topics Concern   Not on file  Social History Narrative   Not on file   Social Drivers of Health   Financial Resource Strain: Not on file  Food Insecurity: No Food Insecurity (10/30/2022)   Hunger Vital Sign    Worried About Running Out of Food in the Last Year: Never true    Ran Out of Food in the Last Year: Never true  Transportation Needs: No Transportation Needs (10/30/2022)   PRAPARE - Administrator, Civil Service (Medical): No    Lack of Transportation (Non-Medical): No  Physical Activity: Not on file  Stress: Not on file  Social Connections: Not on file  Intimate Partner Violence: Not At Risk (10/30/2022)   Humiliation, Afraid, Rape, and Kick questionnaire    Fear of Current or Ex-Partner: No    Emotionally Abused: No    Physically Abused: No    Sexually Abused: No    Physical Exam: Vital signs in last 24 hours: BP 139/69   Pulse 77   Temp 98 F (36.7 C)   Ht 6' (1.829 m)   Wt 214 lb (97.1 kg)   SpO2 98%   BMI 29.02 kg/m  GEN: NAD EYE: Sclerae anicteric ENT: MMM CV: Non-tachycardic Pulm: No increased work of breathing GI: Soft, NT/ND NEURO:  Alert & Oriented   Eulah Pont, MD [facility]  02/05/2023 12:50 PM

## 2023-02-08 ENCOUNTER — Telehealth: Payer: Self-pay | Admitting: *Deleted

## 2023-02-08 NOTE — Telephone Encounter (Signed)
  Follow up Call-     02/05/2023   12:42 PM  Call back number  Post procedure Call Back phone  # [phone]  Permission to leave phone message Yes   [facility]

## 2023-02-12 ENCOUNTER — Encounter: Payer: Self-pay | Admitting: Internal Medicine

## 2023-02-12 LAB — SURGICAL PATHOLOGY

## 2023-02-18 ENCOUNTER — Telehealth (INDEPENDENT_AMBULATORY_CARE_PROVIDER_SITE_OTHER): Payer: Self-pay | Admitting: Family

## 2023-02-18 ENCOUNTER — Encounter: Payer: Self-pay | Admitting: Family

## 2023-02-18 DIAGNOSIS — K219 Gastro-esophageal reflux disease without esophagitis: Secondary | ICD-10-CM | POA: Diagnosis not present

## 2023-02-18 DIAGNOSIS — E782 Mixed hyperlipidemia: Secondary | ICD-10-CM | POA: Diagnosis not present

## 2023-02-18 DIAGNOSIS — E1165 Type 2 diabetes mellitus with hyperglycemia: Secondary | ICD-10-CM

## 2023-02-18 DIAGNOSIS — I10 Essential (primary) hypertension: Secondary | ICD-10-CM

## 2023-02-18 DIAGNOSIS — E1121 Type 2 diabetes mellitus with diabetic nephropathy: Secondary | ICD-10-CM

## 2023-02-18 DIAGNOSIS — Z7984 Long term (current) use of oral hypoglycemic drugs: Secondary | ICD-10-CM

## 2023-02-18 DIAGNOSIS — F17200 Nicotine dependence, unspecified, uncomplicated: Secondary | ICD-10-CM

## 2023-02-18 DIAGNOSIS — F17210 Nicotine dependence, cigarettes, uncomplicated: Secondary | ICD-10-CM

## 2023-02-18 DIAGNOSIS — R208 Other disturbances of skin sensation: Secondary | ICD-10-CM

## 2023-02-18 MED ORDER — LISINOPRIL 20 MG PO TABS: 20.0000 mg | ORAL_TABLET | Freq: Every day | ORAL | 1 refills | Status: DC

## 2023-02-18 MED ORDER — HYDROCHLOROTHIAZIDE 50 MG PO TABS: 50.0000 mg | ORAL_TABLET | Freq: Every day | ORAL | 1 refills | Status: DC

## 2023-02-18 MED ORDER — OMEPRAZOLE 20 MG PO CPDR: 20.0000 mg | DELAYED_RELEASE_CAPSULE | Freq: Every day | ORAL | 1 refills | Status: DC

## 2023-02-18 MED ORDER — JANUMET XR 50-1000 MG PO TB24: 1.0000 | ORAL_TABLET | Freq: Two times a day (BID) | ORAL | 1 refills | Status: DC

## 2023-02-18 MED ORDER — GABAPENTIN 100 MG PO CAPS: 100.0000 mg | ORAL_CAPSULE | Freq: Every day | ORAL | 1 refills | Status: DC | PRN

## 2023-02-18 NOTE — Progress Notes (Signed)
 Virtual Visit Consent   Ricardo E Long Jr., you are scheduled for a virtual visit with a [facility] provider today. Just as with appointments in the office, your consent must be obtained to participate. Your consent will be active for this visit and any virtual visit you may have with one of our providers in the next 365 days. If you have a MyChart account, a copy of this consent can be sent to you electronically.  As this is a virtual visit, video technology does not allow for your provider to perform a traditional examination. This may limit your provider's ability to fully assess your condition. If your provider identifies any concerns that need to be evaluated in person or the need to arrange testing (such as labs, EKG, etc.), we will make arrangements to do so. Although advances in technology are sophisticated, we cannot ensure that it will always work on either your end or our end. If the connection with a video visit is poor, the visit may have to be switched to a telephone visit. With either a video or telephone visit, we are not always able to ensure that we have a secure connection.  By engaging in this virtual visit, you consent to the provision of healthcare and authorize for your insurance to be billed (if applicable) for the services provided during this visit. Depending on your insurance coverage, you may receive a charge related to this service.  I need to obtain your verbal consent now. Are you willing to proceed with your visit today? [name] [name]. has provided verbal consent on 02/18/2023 for a virtual visit (video or telephone). Bari Learn, FNP  Date: 02/18/2023 3:40 PM  Virtual Visit via Video Note   I, Bari Learn, connected with  Ricardo Luna  ([id number], 1965/11/14) on 02/18/23 at  3:25 PM EST by a video-enabled telemedicine application and verified that I am speaking with the correct person using two identifiers.  Location: Patient: Virtual Visit Location  Patient: Home Provider: Virtual Visit Location Provider: Home Office   I discussed the limitations of evaluation and management by telemedicine and the availability of in person appointments. The patient expressed understanding and agreed to proceed.    History of Present Illness: Ricardo Luna. is a 58 y.o. who identifies as a male who was assigned male at birth, and is being seen today for chronic follow up.  HPI: Hypertension This is a chronic problem. The current episode started more than 1 year ago. The problem has been resolved since onset. Pertinent negatives include no blurred vision, malaise/fatigue, peripheral edema or shortness of breath. Risk factors for coronary artery disease include dyslipidemia, diabetes mellitus and obesity. The current treatment provides moderate improvement.  Diabetes He presents for his follow-up diabetic visit. He has type 2 diabetes mellitus. Associated symptoms include foot paresthesias. Pertinent negatives for diabetes include no blurred vision. Diabetic complications include peripheral neuropathy. Risk factors for coronary artery disease include dyslipidemia, diabetes mellitus, hypertension, male sex and sedentary lifestyle. He is following a generally healthy diet. His overall blood glucose range is 90-110 mg/dl.  Gastroesophageal Reflux He complains of belching and heartburn. This is a chronic problem. The current episode started more than 1 year ago. The problem occurs occasionally. He has tried a PPI for the symptoms. The treatment provided moderate relief.  Hyperlipidemia This is a chronic problem. The current episode started more than 1 year ago. The problem is controlled. Recent lipid tests were reviewed and are normal.  Pertinent negatives include no shortness of breath. Current antihyperlipidemic treatment includes statins. The current treatment provides moderate improvement of lipids. Risk factors for coronary artery disease include dyslipidemia,  diabetes mellitus, hypertension and a sedentary lifestyle.  Nicotine Dependence Presents for follow-up visit. His urge triggers include company of smokers. The symptoms have been stable. He smokes 1 pack of cigarettes per day.    Problems:  Patient Active Problem List   Diagnosis Date Noted   Current smoker 06/05/2022   Essential hypertension 06/03/2020   Type 2 diabetes mellitus with hyperglycemia, without long-term current use of insulin  (HCC) 06/03/2020   Gastroesophageal reflux disease 06/03/2020   Mixed hyperlipidemia 06/03/2020   Diabetic nephropathy (HCC) 06/03/2020   History of diverticulitis 06/03/2020   Difficulty sleeping 06/03/2020   History of adenomatous polyp of colon 10/29/2015    Allergies:  Allergies  Allergen Reactions   Penicillins Other (See Comments)    Successful amoxicillin  challenge 11/01/22. Would trial a course under monitoring for first trial.   Medications:  Current Outpatient Medications:    ACCU-CHEK GUIDE test strip, USE 1 STRIP TO CHECK GLUCOSE UP TO 4 TIMES DAILY AS DIRECTED, Disp: , Rfl:    Accu-Chek Softclix Lancets lancets, SMARTSIG:Topical 1-4 Times Daily, Disp: , Rfl:    aspirin  EC 81 MG tablet, Take 81 mg by mouth daily. Swallow whole. (Patient not taking: Reported on 01/22/2023), Disp: , Rfl:    atorvastatin  (LIPITOR) 10 MG tablet, Take 1 tablet (10 mg total) by mouth daily., Disp: 30 tablet, Rfl: 2   gabapentin  (NEURONTIN ) 100 MG capsule, Take 1-3 capsules (100-300 mg total) by mouth daily as needed., Disp: 90 capsule, Rfl: 1   hydrochlorothiazide  (HYDRODIURIL ) 50 MG tablet, Take 1 tablet (50 mg total) by mouth daily., Disp: 90 tablet, Rfl: 1   lisinopril  (ZESTRIL ) 20 MG tablet, Take 1 tablet (20 mg total) by mouth daily., Disp: 90 tablet, Rfl: 1   omeprazole  (PRILOSEC) 20 MG capsule, Take 1 capsule (20 mg total) by mouth daily., Disp: 90 capsule, Rfl: 1   SitaGLIPtin -MetFORMIN  HCl (JANUMET  XR) 50-1000 MG TB24, Take 1 tablet by mouth 2 (two)  times daily., Disp: 180 tablet, Rfl: 1  Observations/Objective: Patient is well-developed, well-nourished in no acute distress.  Resting comfortably  at home.  Head is normocephalic, atraumatic.  No labored breathing.  Speech is clear and coherent with logical content.  Patient is alert and oriented at baseline.    Assessment and Plan: 1. Type 2 diabetes mellitus with hyperglycemia, without long-term current use of insulin  (HCC) (Primary) - Bayer DCA Hb A1c Waived - CMP14+EGFR - CBC with Differential/Platelet - Microalbumin / creatinine urine ratio - SitaGLIPtin -MetFORMIN  HCl (JANUMET  XR) 50-1000 MG TB24; Take 1 tablet by mouth 2 (two) times daily.  Dispense: 180 tablet; Refill: 1  2. Essential hypertension - CMP14+EGFR - CBC with Differential/Platelet - lisinopril  (ZESTRIL ) 20 MG tablet; Take 1 tablet (20 mg total) by mouth daily.  Dispense: 90 tablet; Refill: 1 - hydrochlorothiazide  (HYDRODIURIL ) 50 MG tablet; Take 1 tablet (50 mg total) by mouth daily.  Dispense: 90 tablet; Refill: 1  3. Gastroesophageal reflux disease, unspecified whether esophagitis present - CMP14+EGFR - CBC with Differential/Platelet - omeprazole  (PRILOSEC) 20 MG capsule; Take 1 capsule (20 mg total) by mouth daily.  Dispense: 90 capsule; Refill: 1  4. Mixed hyperlipidemia - CMP14+EGFR - CBC with Differential/Platelet  5. Current smoker - CMP14+EGFR - CBC with Differential/Platelet  6. Diabetic nephropathy associated with type 2 diabetes mellitus (HCC) - CMP14+EGFR - CBC with  Differential/Platelet - gabapentin  (NEURONTIN ) 100 MG capsule; Take 1-3 capsules (100-300 mg total) by mouth daily as needed.  Dispense: 90 capsule; Refill: 1  7. Burning sensation of feet - CMP14+EGFR - CBC with Differential/Platelet  Labs pending  Continue current medications  Keep follow up if symptoms worsen or do not improve Follow up 4 months   Follow Up Instructions: I discussed the assessment and treatment plan  with the patient. The patient was provided an opportunity to ask questions and all were answered. The patient agreed with the plan and demonstrated an understanding of the instructions.  A copy of instructions were sent to the patient via MyChart unless otherwise noted below.     The patient was advised to call back or seek an in-person evaluation if the symptoms worsen or if the condition fails to improve as anticipated.    Bari Learn, FNP

## 2023-03-05 ENCOUNTER — Telehealth: Payer: Self-pay | Admitting: Internal Medicine

## 2023-03-05 NOTE — Telephone Encounter (Signed)
Patient called and stated that he is wanting to know if we have sent out a referral to have a surgery done for his rectal wart based on the result of his last colonoscopy. Patient is requesting for the number to general surgery. Please advise.

## 2023-03-08 NOTE — Telephone Encounter (Signed)
Patient was referred to [facility]. No record in Care Everywhere. That office is closed today for the holiday. Called the patient to advise we will follow up on the referral. No answer. Left him a message.

## 2023-03-10 NOTE — Telephone Encounter (Signed)
[facility] [phone] Confirmed the referral has been received. 3 messages have been left for the patient. They are waiting for the return call.  Called the patient. No answer. Left information of name of the office he has been referred to and the telephone number.

## 2023-05-10 ENCOUNTER — Other Ambulatory Visit: Payer: Self-pay | Admitting: Family

## 2023-05-10 DIAGNOSIS — E782 Mixed hyperlipidemia: Secondary | ICD-10-CM

## 2023-05-10 DIAGNOSIS — E1165 Type 2 diabetes mellitus with hyperglycemia: Secondary | ICD-10-CM

## 2023-06-22 ENCOUNTER — Other Ambulatory Visit: Payer: Self-pay | Admitting: Family Medicine

## 2023-07-06 ENCOUNTER — Telehealth: Payer: Self-pay | Admitting: Family

## 2023-07-19 ENCOUNTER — Ambulatory Visit: Admitting: Family

## 2023-07-19 ENCOUNTER — Ambulatory Visit (INDEPENDENT_AMBULATORY_CARE_PROVIDER_SITE_OTHER)

## 2023-07-19 ENCOUNTER — Encounter: Payer: Self-pay | Admitting: Family

## 2023-07-19 VITALS — BP 126/82 | HR 91 | Temp 98.4°F | Ht 72.0 in | Wt 218.0 lb

## 2023-07-19 DIAGNOSIS — I10 Essential (primary) hypertension: Secondary | ICD-10-CM

## 2023-07-19 DIAGNOSIS — E782 Mixed hyperlipidemia: Secondary | ICD-10-CM | POA: Diagnosis not present

## 2023-07-19 DIAGNOSIS — E1165 Type 2 diabetes mellitus with hyperglycemia: Secondary | ICD-10-CM

## 2023-07-19 DIAGNOSIS — Z7984 Long term (current) use of oral hypoglycemic drugs: Secondary | ICD-10-CM

## 2023-07-19 DIAGNOSIS — Z0001 Encounter for general adult medical examination with abnormal findings: Secondary | ICD-10-CM | POA: Diagnosis not present

## 2023-07-19 DIAGNOSIS — F17200 Nicotine dependence, unspecified, uncomplicated: Secondary | ICD-10-CM

## 2023-07-19 DIAGNOSIS — E1121 Type 2 diabetes mellitus with diabetic nephropathy: Secondary | ICD-10-CM

## 2023-07-19 DIAGNOSIS — K219 Gastro-esophageal reflux disease without esophagitis: Secondary | ICD-10-CM

## 2023-07-19 DIAGNOSIS — Z0000 Encounter for general adult medical examination without abnormal findings: Secondary | ICD-10-CM

## 2023-07-19 DIAGNOSIS — Z122 Encounter for screening for malignant neoplasm of respiratory organs: Secondary | ICD-10-CM

## 2023-07-19 DIAGNOSIS — L255 Unspecified contact dermatitis due to plants, except food: Secondary | ICD-10-CM

## 2023-07-19 LAB — HM DIABETES EYE EXAM

## 2023-07-19 LAB — BAYER DCA HB A1C WAIVED: HB A1C (BAYER DCA - WAIVED): 7.2 % — ABNORMAL HIGH (ref 4.8–5.6)

## 2023-07-19 MED ORDER — TRIAMCINOLONE ACETONIDE 0.5 % EX OINT: 1.0000 | TOPICAL_OINTMENT | Freq: Two times a day (BID) | CUTANEOUS | 0 refills | Status: AC

## 2023-07-19 MED ORDER — ATORVASTATIN CALCIUM 10 MG PO TABS: 10.0000 mg | ORAL_TABLET | Freq: Every day | ORAL | 0 refills | Status: DC

## 2023-07-19 MED ORDER — LISINOPRIL 20 MG PO TABS: 20.0000 mg | ORAL_TABLET | Freq: Every day | ORAL | 1 refills | Status: DC

## 2023-07-19 MED ORDER — HYDROCHLOROTHIAZIDE 50 MG PO TABS: 50.0000 mg | ORAL_TABLET | Freq: Every day | ORAL | 1 refills | Status: DC

## 2023-07-19 MED ORDER — JANUMET XR 50-1000 MG PO TB24
1.0000 | ORAL_TABLET | Freq: Two times a day (BID) | ORAL | 1 refills | Status: AC
Start: 2023-07-19 — End: ?

## 2023-07-19 MED ORDER — METHYLPREDNISOLONE ACETATE 80 MG/ML IJ SUSP
80.0000 mg | Freq: Once | INTRAMUSCULAR | Status: AC
Start: 2023-07-19 — End: 2023-07-19
  Administered 2023-07-19: 80 mg via INTRAMUSCULAR

## 2023-07-19 MED ORDER — OMEPRAZOLE 20 MG PO CPDR: 20.0000 mg | DELAYED_RELEASE_CAPSULE | Freq: Every day | ORAL | 1 refills | Status: AC

## 2023-07-19 NOTE — Progress Notes (Signed)
 Ricardo Luna. arrived 07/19/2023 and has given verbal consent to obtain images and complete their overdue diabetic retinal screening.  The images have been sent to an ophthalmologist or optometrist for review and interpretation.  Results will be sent back to Yevette Hem, FNP for review.  Patient has been informed they will be contacted when we receive the results via telephone or MyChart

## 2023-07-19 NOTE — Patient Instructions (Signed)
Poison Oak Dermatitis  Poison oak dermatitis is inflammation of the skin that is caused by contact with the chemicals in the leaves of the poison oak (Toxicodendron) plant. The skin reaction often includes redness, swelling, blisters, and extreme itching. What are the causes? This condition is caused by a specific chemical (urushiol) that is found in the sap of the poison oak plant. This chemical is sticky and can be easily spread to people, animals, and objects. You can get poison oak dermatitis by: Having direct contact with a poison oak plant. Touching animals, other people, or objects that have come in contact with poison oak and have the chemical on them. What increases the risk? This condition is more likely to develop in people who: Are outdoors often in wooded or [place] areas. Go outdoors without wearing protective clothing, such as closed shoes, long pants, and a long-sleeved shirt. What are the signs or symptoms? Symptoms of this condition include: Redness of the skin. Extreme itching. A rash that often includes bumps and blisters. The rash usually appears 48 hours after exposure if you have been exposed before. If this is the first time you have been exposed, the rash may not appear until a week after exposure. Swelling. This may occur if the reaction is more severe. Symptoms usually last for 1-2 weeks. However, the first time you develop this condition, symptoms may last 3-4 weeks. How is this diagnosed? This condition may be diagnosed based on your symptoms and a physical exam. Your health care provider may also ask you about any recent outdoor activity. How is this treated? Treatment for this condition will vary depending on how severe it is. Treatment may include: Hydrocortisone creams or calamine lotions to relieve itching. Oatmeal baths to soothe the skin. Over-the-counter antihistamine medicines to help reduce itching. Steroid medicine taken by mouth (orally) for more  severe reactions. Follow these instructions at home: Medicines Take or apply over-the-counter and prescription medicines only as told by your health care provider. Use hydrocortisone creams or calamine lotion as needed to soothe the skin and relieve itching. General instructions Do not scratch or rub your skin. Apply a cold, wet cloth (cold compress) to the affected areas or take baths in cool water. This will help with itching. Avoid hot baths and showers. Take oatmeal baths as needed. Use colloidal oatmeal. You can get this at your local pharmacy or grocery store. Follow the instructions on the packaging. Wash clothes, bedsheets, towels, and blankets that you wore or came in contact with between your exposure to the plant and the appearance of your rash. The oils can remain on these items and continue to cause new exposure. Check the affected area every day for signs of infection. Check for: More redness, swelling, or pain. Fluid or blood. Warmth. Pus or a bad smell. Keep all follow-up visits. Your health care provider may want to see how your skin is progressing with treatment. How is this prevented?  Learn to identify the poison oak plant and avoid contact with the plant. This plant can be recognized by the number of leaves. Generally, poison oak has three leaves with flowering branches on a single stem. The leaves are often a bit fuzzy and have a toothlike edge. If you have been exposed to poison oak, thoroughly wash your skin with soap and water right away. You have about 30 minutes to remove the plant resin before it will cause the rash. Be sure to wash under your fingernails because any plant resin there  will continue to spread the rash. When hiking or camping, wear clothes that will help you avoid exposure on the skin. This includes long pants, a long-sleeved shirt, long socks, and hiking boots. You can also apply preventive lotion to your skin to help limit exposure. If you suspect  that your clothes or outdoor gear came in contact with poison oak, rinse them off outside with a garden hose before bringing them inside your house. When doing yard work or gardening, wear gloves, long sleeves, long pants, and boots. Wash your garden tools and gloves if they come in contact with poison oak. If you suspect that your pet has come into contact with poison oak, wash them with pet shampoo and water. Make sure you wear gloves while washing your pet. Do not burn poison oak plants. This can release the chemical from the plant into the air and may cause a reaction on the skin or eyes, or in the lungs from breathing in the smoke. Contact a health care provider if: You have open sores in the rash area. You have any signs of infection. You have redness that spreads beyond the rash area. You have a fever. You have a rash over a large area of your body. You have a rash on your eyes, mouth, or genitals. You have a rash that does not improve after a few weeks. Get help right away if: Your face swells or your eyes swell shut. You have trouble breathing. You have trouble swallowing. These symptoms may be an emergency. Get help right away. Call 911. Do not wait to see if the symptoms will go away. Do not drive yourself to the hospital. This information is not intended to replace advice given to you by your health care provider. Make sure you discuss any questions you have with your health care provider. Document Revised: 08/13/2021 Document Reviewed: 07/03/2021 Elsevier Patient Education  2024 [employer].

## 2023-07-19 NOTE — Progress Notes (Signed)
 Subjective:    Patient ID: Ricardo Daunt., male    DOB: 1965-10-02, 58 y.o.   MRN: [id number]  Chief Complaint  Patient presents with   [facility] Management of Chronic Issues   PT presents to the office today for CPE and  chronic follow up.  Hypertension This is a chronic problem. The current episode started more than 1 year ago. The problem has been resolved since onset. The problem is controlled. Pertinent negatives include no blurred vision, malaise/fatigue, peripheral edema or shortness of breath. Risk factors for coronary artery disease include dyslipidemia, diabetes mellitus, male gender and smoking/tobacco exposure. The current treatment provides moderate improvement.  Gastroesophageal Reflux He complains of belching and heartburn. This is a chronic problem. The current episode started more than 1 year ago. The problem occurs rarely. The symptoms are aggravated by certain foods. Risk factors include obesity and smoking/tobacco exposure. He has tried a PPI for the symptoms. The treatment provided moderate relief.  Diabetes He presents for his follow-up diabetic visit. He has type 2 diabetes mellitus. Associated symptoms include foot paresthesias (great toes). Pertinent negatives for diabetes include no blurred vision. Diabetic complications include peripheral neuropathy. Risk factors for coronary artery disease include diabetes mellitus, dyslipidemia, hypertension, male sex and sedentary lifestyle. He is following a generally unhealthy diet. His overall blood glucose range is 140-180 mg/dl. Eye exam is not current.  Hyperlipidemia This is a chronic problem. The current episode started more than 1 year ago. The problem is controlled. Recent lipid tests were reviewed and are normal. Exacerbating diseases include obesity. Pertinent negatives include no shortness of breath. Current antihyperlipidemic treatment includes statins. The current treatment provides moderate improvement  of lipids. Risk factors for coronary artery disease include dyslipidemia, diabetes mellitus, hypertension, male sex and a sedentary lifestyle.  Nicotine Dependence Presents for follow-up visit. The symptoms have been stable. He smokes 1 pack of cigarettes per day.  Rash This is a new problem. The current episode started 1 to 4 weeks ago. The problem has been gradually worsening since onset. The affected locations include the right lower leg, face, left wrist and right wrist. The rash is characterized by redness and itchiness. Pertinent negatives include no shortness of breath. Past treatments include anti-itch cream. The treatment provided mild relief.      Review of Systems  Constitutional:  Negative for malaise/fatigue.  Eyes:  Negative for blurred vision.  Respiratory:  Negative for shortness of breath.   Gastrointestinal:  Positive for heartburn.  Skin:  Positive for rash.  All other systems reviewed and are negative.   Family History  Problem Relation Age of Onset   Hypertension Mother    Diabetes Mother    Stomach cancer Mother    Diabetes Father    Hypertension Father    Thyroid cancer Daughter    Colon cancer Neg Hx    Colon polyps Neg Hx    Esophageal cancer Neg Hx    Rectal cancer Neg Hx    Social History   Socioeconomic History   Marital status: Significant Other    Spouse name: Not on file   Number of children: Not on file   Years of education: Not on file   Highest education level: Not on file  Occupational History   Not on file  Tobacco Use   Smoking status: Every Day    Current packs/day: 1.00    Types: Cigarettes   Smokeless tobacco: Never  Vaping Use   Vaping  status: Never Used  Substance and Sexual Activity   Alcohol use: Yes    Comment: every other day, 2-3 beers at a time   Drug use: No   Sexual activity: Yes    Birth control/protection: None  Other Topics Concern   Not on file  Social History Narrative   Not on file   Social Drivers of  Health   Financial Resource Strain: Not on file  Food Insecurity: No Food Insecurity (10/30/2022)   Hunger Vital Sign    Worried About Running Out of Food in the Last Year: Never true    Ran Out of Food in the Last Year: Never true  Transportation Needs: No Transportation Needs (10/30/2022)   PRAPARE - Administrator, Civil Service (Medical): No    Lack of Transportation (Non-Medical): No  Physical Activity: Not on file  Stress: Not on file  Social Connections: Not on file       Objective:   Physical Exam Vitals reviewed.  Constitutional:      General: He is not in acute distress.    Appearance: He is well-developed.  HENT:     Head: Normocephalic.     Right Ear: Tympanic membrane normal.     Left Ear: Tympanic membrane normal.  Eyes:     General:        Right eye: No discharge.        Left eye: No discharge.     Pupils: Pupils are equal, round, and reactive to light.  Neck:     Thyroid: No thyromegaly.  Cardiovascular:     Rate and Rhythm: Normal rate and regular rhythm.     Heart sounds: Normal heart sounds. No murmur heard. Pulmonary:     Effort: Pulmonary effort is normal. No respiratory distress.     Breath sounds: Normal breath sounds. No wheezing.  Abdominal:     General: Bowel sounds are normal. There is no distension.     Palpations: Abdomen is soft.     Tenderness: There is no abdominal tenderness.  Musculoskeletal:        General: No tenderness. Normal range of motion.     Cervical back: Normal range of motion and neck supple.  Skin:    General: Skin is warm and dry.     Findings: Erythema and rash present.       Neurological:     Mental Status: He is alert and oriented to person, place, and time.     Cranial Nerves: No cranial nerve deficit.     Deep Tendon Reflexes: Reflexes are normal and symmetric.  Psychiatric:        Behavior: Behavior normal.        Thought Content: Thought content normal.        Judgment: Judgment normal.      Diabetic Foot Exam - Simple   Simple Foot Form Diabetic Foot exam was performed with the following findings: Yes 07/19/2023  8:34 AM  Visual Inspection No deformities, no ulcerations, no other skin breakdown bilaterally: Yes Sensation Testing See comments: Yes Pulse Check Posterior Tibialis and Dorsalis pulse intact bilaterally: Yes Comments Negative monofilament in bilateral great toes      BP 126/82   Pulse 91   Temp 98.4 F (36.9 C) (Temporal)   Ht 6' (1.829 m)   Wt 218 lb (98.9 kg)   SpO2 94%   BMI 29.57 kg/m      Assessment & Plan:   Ricardo Daunt.  comes in today with chief complaint of Poison [facility] and [facility] Management of Chronic Issues   Diagnosis and orders addressed:  1. Mixed hyperlipidemia - CBC with Differential/Platelet - Lipid panel - CMP14+EGFR - atorvastatin  (LIPITOR) 10 MG tablet; Take 1 tablet (10 mg total) by mouth daily.  Dispense: 90 tablet; Refill: 0  2. Type 2 diabetes mellitus with hyperglycemia, without long-term current use of insulin  (HCC) - Microalbumin / creatinine urine ratio - Bayer DCA Hb A1c Waived - CBC with Differential/Platelet - CMP14+EGFR - atorvastatin  (LIPITOR) 10 MG tablet; Take 1 tablet (10 mg total) by mouth daily.  Dispense: 90 tablet; Refill: 0 - SitaGLIPtin -MetFORMIN  HCl (JANUMET  XR) 50-1000 MG TB24; Take 1 tablet by mouth 2 (two) times daily.  Dispense: 180 tablet; Refill: 1  3. Essential hypertension - CBC with Differential/Platelet - CMP14+EGFR - hydrochlorothiazide  (HYDRODIURIL ) 50 MG tablet; Take 1 tablet (50 mg total) by mouth daily.  Dispense: 90 tablet; Refill: 1 - lisinopril  (ZESTRIL ) 20 MG tablet; Take 1 tablet (20 mg total) by mouth daily.  Dispense: 90 tablet; Refill: 1  4. Gastroesophageal reflux disease, unspecified whether esophagitis present  - CBC with Differential/Platelet - CMP14+EGFR - omeprazole  (PRILOSEC) 20 MG capsule; Take 1 capsule (20 mg total) by mouth daily.  Dispense: 90  capsule; Refill: 1  5. Annual physical exam (Primary)  - Microalbumin / creatinine urine ratio - Bayer DCA Hb A1c Waived - CBC with Differential/Platelet - Lipid panel - CMP14+EGFR  6. Diabetic nephropathy associated with type 2 diabetes mellitus (HCC) - CBC with Differential/Platelet - CMP14+EGFR  7. Current smoker - CBC with Differential/Platelet - CMP14+EGFR - Ambulatory Referral Lung Cancer Screening [facility]  8. Screening for lung cancer - Ambulatory Referral Lung Cancer Screening [facility]  9. Contact dermatitis due to plant Strict low carb diet  Avoid scratching  Keep clean and dry - methylPREDNISolone  acetate (DEPO-MEDROL ) injection 80 mg - triamcinolone ointment (KENALOG) 0.5 %; Apply 1 Application topically 2 (two) times daily.  Dispense: 30 g; Refill: 0   Labs pending Continue current medications  Health Maintenance reviewed Diet and exercise encouraged  Follow up plan: 3 months    Tommas Fragmin, FNP

## 2023-07-20 ENCOUNTER — Ambulatory Visit: Payer: Self-pay | Admitting: Family

## 2023-07-20 ENCOUNTER — Encounter: Payer: Self-pay | Admitting: Family

## 2023-07-20 ENCOUNTER — Ambulatory Visit: Payer: Self-pay

## 2023-07-20 LAB — CBC WITH DIFFERENTIAL/PLATELET
Basophils Absolute: 0 10*3/uL (ref 0.0–0.2)
Basos: 1 %
EOS (ABSOLUTE): 0.4 10*3/uL (ref 0.0–0.4)
Eos: 6 %
Hematocrit: 40.8 % (ref 37.5–51.0)
Hemoglobin: 13.6 g/dL (ref 13.0–17.7)
Immature Grans (Abs): 0 10*3/uL (ref 0.0–0.1)
Immature Granulocytes: 0 %
Lymphocytes Absolute: 2.1 10*3/uL (ref 0.7–3.1)
Lymphs: 30 %
MCH: 30.8 pg (ref 26.6–33.0)
MCHC: 33.3 g/dL (ref 31.5–35.7)
MCV: 92 fL (ref 79–97)
Monocytes Absolute: 0.6 10*3/uL (ref 0.1–0.9)
Monocytes: 8 %
NRBC: 3 % — ABNORMAL HIGH (ref 0–0)
Neutrophils Absolute: 3.8 10*3/uL (ref 1.4–7.0)
Neutrophils: 55 %
Platelets: 287 10*3/uL (ref 150–450)
RBC: 4.42 x10E6/uL (ref 4.14–5.80)
RDW: 12.1 % (ref 11.6–15.4)
WBC: 6.9 10*3/uL (ref 3.4–10.8)

## 2023-07-20 LAB — CMP14+EGFR
ALT: 28 IU/L (ref 0–44)
AST: 19 IU/L (ref 0–40)
Albumin: 4.7 g/dL (ref 3.8–4.9)
Alkaline Phosphatase: 69 IU/L (ref 44–121)
BUN/Creatinine Ratio: 12 (ref 9–20)
BUN: 9 mg/dL (ref 6–24)
Bilirubin Total: 0.5 mg/dL (ref 0.0–1.2)
CO2: 22 mmol/L (ref 20–29)
Calcium: 9.8 mg/dL (ref 8.7–10.2)
Chloride: 95 mmol/L — ABNORMAL LOW (ref 96–106)
Creatinine, Ser: 0.76 mg/dL (ref 0.76–1.27)
Globulin, Total: 2.7 g/dL (ref 1.5–4.5)
Glucose: 211 mg/dL — ABNORMAL HIGH (ref 70–99)
Potassium: 4.3 mmol/L (ref 3.5–5.2)
Sodium: 135 mmol/L (ref 134–144)
Total Protein: 7.4 g/dL (ref 6.0–8.5)
eGFR: 104 mL/min/{1.73_m2} (ref >59)

## 2023-07-20 LAB — MICROALBUMIN / CREATININE URINE RATIO
Creatinine, Urine: 93.4 mg/dL
Microalb/Creat Ratio: 21 mg/g{creat} (ref 0–29)
Microalbumin, Urine: 19.2 ug/mL

## 2023-07-20 LAB — LIPID PANEL
Chol/HDL Ratio: 3.3 ratio (ref 0.0–5.0)
Cholesterol, Total: 131 mg/dL (ref 100–199)
HDL: 40 mg/dL (ref >39)
LDL Chol Calc (NIH): 62 mg/dL (ref 0–99)
Triglycerides: 172 mg/dL — ABNORMAL HIGH (ref 0–149)
VLDL Cholesterol Cal: 29 mg/dL (ref 5–40)

## 2023-08-13 ENCOUNTER — Other Ambulatory Visit: Payer: Self-pay

## 2023-08-13 ENCOUNTER — Telehealth: Payer: Self-pay | Admitting: Acute Care

## 2023-08-13 DIAGNOSIS — Z87891 Personal history of nicotine dependence: Secondary | ICD-10-CM

## 2023-08-13 DIAGNOSIS — F17210 Nicotine dependence, cigarettes, uncomplicated: Secondary | ICD-10-CM

## 2023-08-13 DIAGNOSIS — Z122 Encounter for screening for malignant neoplasm of respiratory organs: Secondary | ICD-10-CM

## 2023-08-13 NOTE — Telephone Encounter (Signed)
 Lung Cancer Screening Narrative/Criteria Questionnaire (Cigarette Smokers Only- No Cigars/Pipes/vapes)   [name] Ricardo Luna.   SDMV:08/25/23 at 0915/Kathy                                           1965/04/30               LDCT: 08/26/23 at [address]    58 y.o.   Phone: [phone]  Lung Screening Narrative (confirm age 60-77 yrs Medicare / 50-80 yrs Private pay insurance)   Insurance information:[employer]    Referring Provider:[facility]   This screening involves an initial phone call with a team member from our program. It is called a shared decision making visit. The initial meeting is required by insurance and Medicare to make sure you understand the program. This appointment takes about 15-20 minutes to complete. The CT scan will completed at a separate date/time. This scan takes about 5-10 minutes to complete and you may eat and drink before and after the scan.  Criteria questions for Lung Cancer Screening:   Are you a current or former smoker? Current Age began smoking: 18y   If you are a former smoker, what year did you quit smoking? NA   To calculate your smoking history, I need an accurate estimate of how many packs of cigarettes you smoked per day and for how many years. (Not just the number of PPD you are now smoking)   Years smoking 39 x Packs per day 1 = Pack years 39   (at least 20 pack yrs)   (Make sure they understand that we need to know how much they have smoked in the past, not just the number of PPD they are smoking now)  Do you have a personal history of cancer?  No    Do you have a family history of cancer? Yes  (cancer type and and relative) mother/stomach  Are you coughing up blood?  No  Have you had unexplained weight loss of 15 lbs or more in the last 6 months? No  It looks like you meet all criteria.     Additional information: N/A

## 2023-08-16 ENCOUNTER — Encounter: Payer: Self-pay | Admitting: Acute Care

## 2023-08-20 LAB — LAB REPORT - SCANNED

## 2023-08-25 ENCOUNTER — Ambulatory Visit (INDEPENDENT_AMBULATORY_CARE_PROVIDER_SITE_OTHER): Payer: Self-pay | Admitting: Adult Health

## 2023-08-25 ENCOUNTER — Encounter: Payer: Self-pay | Admitting: Adult Health

## 2023-08-25 DIAGNOSIS — F17210 Nicotine dependence, cigarettes, uncomplicated: Secondary | ICD-10-CM

## 2023-08-25 NOTE — Progress Notes (Signed)
  Virtual Visit via Telephone Note  I connected with Ricardo E Bonneau Jr. , 08/25/23 9:08 AM by a telemedicine application and verified that I am speaking with the correct person using two identifiers.  Location: Patient: home Provider: home   I discussed the limitations of evaluation and management by telemedicine and the availability of in person appointments. The patient expressed understanding and agreed to proceed.   Shared Decision Making Visit Lung Cancer Screening Program [phone])   Eligibility: 58 y.o. Pack Years Smoking History Calculation = 39 pack years  (# packs/per year x # years smoked) Recent History of coughing up blood  no Unexplained weight loss? no ( >Than 15 pounds within the last 6 months ) Prior History Lung / other cancer no (Diagnosis within the last 5 years already requiring surveillance chest CT Scans). Smoking Status Current Smoker  Visit Components: Discussion included one or more decision making aids. YES Discussion included risk/benefits of screening. YES Discussion included potential follow up diagnostic testing for abnormal scans. YES Discussion included meaning and risk of over diagnosis. YES Discussion included meaning and risk of False Positives. YES Discussion included meaning of total radiation exposure. YES  Counseling Included: Importance of adherence to annual lung cancer LDCT screening. YES Impact of comorbidities on ability to participate in the program. YES Ability and willingness to under diagnostic treatment. YES  Smoking Cessation Counseling: Current Smokers:  Discussed importance of smoking cessation. yes Information about tobacco cessation classes and interventions provided to patient. yes Patient provided with ticket for LDCT Scan. yes Symptomatic Patient. NO Diagnosis Code: Tobacco Use Z72.0 Asymptomatic Patient yes  Counseling - 4 minutes of smoking cessation counseling  (CT Chest Lung Cancer Screening Low Dose W/O  CM) [id number]  Z12.2-Screening of respiratory organs Z87.891-Personal history of nicotine dependence   Ricardo Luna 08/25/23

## 2023-08-25 NOTE — Patient Instructions (Signed)

## 2023-08-26 ENCOUNTER — Ambulatory Visit
Admission: RE | Admit: 2023-08-26 | Discharge: 2023-08-26 | Disposition: A | Discharge status: Home / Self Care | Payer: Self-pay | Source: Ambulatory Visit

## 2023-08-26 DIAGNOSIS — Z87891 Personal history of nicotine dependence: Secondary | ICD-10-CM

## 2023-08-26 DIAGNOSIS — Z122 Encounter for screening for malignant neoplasm of respiratory organs: Secondary | ICD-10-CM

## 2023-08-26 DIAGNOSIS — F17210 Nicotine dependence, cigarettes, uncomplicated: Secondary | ICD-10-CM

## 2023-09-06 ENCOUNTER — Other Ambulatory Visit: Payer: Self-pay | Admitting: Acute Care

## 2023-09-06 DIAGNOSIS — Z87891 Personal history of nicotine dependence: Secondary | ICD-10-CM

## 2023-09-06 DIAGNOSIS — Z122 Encounter for screening for malignant neoplasm of respiratory organs: Secondary | ICD-10-CM

## 2023-09-06 DIAGNOSIS — F17210 Nicotine dependence, cigarettes, uncomplicated: Secondary | ICD-10-CM

## 2023-10-01 ENCOUNTER — Other Ambulatory Visit: Payer: Self-pay | Admitting: Family

## 2023-10-01 DIAGNOSIS — E1121 Type 2 diabetes mellitus with diabetic nephropathy: Secondary | ICD-10-CM

## 2023-10-21 ENCOUNTER — Encounter: Payer: Self-pay | Admitting: Family

## 2023-10-21 ENCOUNTER — Ambulatory Visit: Admitting: Family

## 2023-10-21 VITALS — BP 109/75 | HR 88 | Temp 98.1°F | Ht 72.0 in | Wt 216.8 lb

## 2023-10-21 DIAGNOSIS — E1121 Type 2 diabetes mellitus with diabetic nephropathy: Secondary | ICD-10-CM

## 2023-10-21 DIAGNOSIS — K219 Gastro-esophageal reflux disease without esophagitis: Secondary | ICD-10-CM

## 2023-10-21 DIAGNOSIS — E1165 Type 2 diabetes mellitus with hyperglycemia: Secondary | ICD-10-CM

## 2023-10-21 DIAGNOSIS — E782 Mixed hyperlipidemia: Secondary | ICD-10-CM | POA: Diagnosis not present

## 2023-10-21 DIAGNOSIS — N529 Male erectile dysfunction, unspecified: Secondary | ICD-10-CM

## 2023-10-21 DIAGNOSIS — I10 Essential (primary) hypertension: Secondary | ICD-10-CM

## 2023-10-21 DIAGNOSIS — F17200 Nicotine dependence, unspecified, uncomplicated: Secondary | ICD-10-CM

## 2023-10-21 LAB — BAYER DCA HB A1C WAIVED: HB A1C (BAYER DCA - WAIVED): 7.1 % — ABNORMAL HIGH (ref 4.8–5.6)

## 2023-10-21 MED ORDER — ATORVASTATIN CALCIUM 10 MG PO TABS: 10.0000 mg | ORAL_TABLET | Freq: Every day | ORAL | 3 refills | Status: AC

## 2023-10-21 MED ORDER — SILDENAFIL CITRATE 100 MG PO TABS: 50.0000 mg | ORAL_TABLET | Freq: Every day | ORAL | 2 refills | Status: AC | PRN

## 2023-10-21 MED ORDER — GABAPENTIN 100 MG PO CAPS: ORAL_CAPSULE | ORAL | 3 refills | Status: AC

## 2023-10-21 NOTE — Patient Instructions (Signed)

## 2023-10-21 NOTE — Progress Notes (Signed)
 Subjective:    Patient ID: Ricardo FORBES [name]., male    DOB: 10-15-1965, 58 y.o.   MRN: [id number]  Chief Complaint  Patient presents with   3 MONTH FOLLOW UP   PT presents to the office today for chronic follow up.   Complaining of ED. Reports having a hard time getting an erection. Requesting medications.  Hypertension This is a chronic problem. The current episode started more than 1 year ago. The problem has been resolved since onset. The problem is controlled. Pertinent negatives include no blurred vision, malaise/fatigue or peripheral edema. Risk factors for coronary artery disease include dyslipidemia, diabetes mellitus, male gender and smoking/tobacco exposure. The current treatment provides moderate improvement.  Gastroesophageal Reflux He complains of belching and heartburn. This is a chronic problem. The current episode started more than 1 year ago. The problem occurs rarely. The symptoms are aggravated by certain foods. Risk factors include obesity and smoking/tobacco exposure. He has tried a PPI for the symptoms. The treatment provided moderate relief.  Diabetes He presents for his follow-up diabetic visit. He has type 2 diabetes mellitus. Associated symptoms include foot paresthesias (great toes). Pertinent negatives for diabetes include no blurred vision. Diabetic complications include peripheral neuropathy. Risk factors for coronary artery disease include diabetes mellitus, dyslipidemia, hypertension, male sex and sedentary lifestyle. He is following a generally healthy diet. His overall blood glucose range is 140-180 mg/dl. Eye exam is not current.  Hyperlipidemia This is a chronic problem. The current episode started more than 1 year ago. The problem is controlled. Recent lipid tests were reviewed and are normal. Exacerbating diseases include obesity. Current antihyperlipidemic treatment includes statins. The current treatment provides moderate improvement of lipids. Risk  factors for coronary artery disease include dyslipidemia, diabetes mellitus, hypertension, male sex and a sedentary lifestyle.  Nicotine Dependence Presents for follow-up visit. The symptoms have been stable. He smokes 1 pack of cigarettes per day.      Review of Systems  Constitutional:  Negative for malaise/fatigue.  Eyes:  Negative for blurred vision.  Gastrointestinal:  Positive for heartburn.  All other systems reviewed and are negative.   Family History  Problem Relation Age of Onset   Hypertension Mother    Diabetes Mother    Stomach cancer Mother    Diabetes Father    Hypertension Father    Thyroid cancer Daughter    Colon cancer Neg Hx    Colon polyps Neg Hx    Esophageal cancer Neg Hx    Rectal cancer Neg Hx    Social History   Socioeconomic History   Marital status: Significant Other    Spouse name: Not on file   Number of children: Not on file   Years of education: Not on file   Highest education level: Not on file  Occupational History   Not on file  Tobacco Use   Smoking status: Every Day    Current packs/day: 1.00    Types: Cigarettes   Smokeless tobacco: Never  Vaping Use   Vaping status: Never Used  Substance and Sexual Activity   Alcohol use: Yes    Comment: every other day, 2-3 beers at a time   Drug use: No   Sexual activity: Yes    Birth control/protection: None  Other Topics Concern   Not on file  Social History Narrative   Not on file   Social Drivers of Health   Financial Resource Strain: Not on file  Food Insecurity: No Food Insecurity (10/30/2022)  Hunger Vital Sign    Worried About Running Out of Food in the Last Year: Never true    Ran Out of Food in the Last Year: Never true  Transportation Needs: No Transportation Needs (10/30/2022)   PRAPARE - Administrator, Civil Service (Medical): No    Lack of Transportation (Non-Medical): No  Physical Activity: Inactive (10/17/2023)   Exercise Vital Sign    Days of  Exercise per Week: 0 days    Minutes of Exercise per Session: Not on file  Stress: No Stress Concern Present (10/17/2023)   [employer] of Occupational Health - Occupational Stress Questionnaire    Feeling of Stress: Not at all  Social Connections: Unknown (10/17/2023)   Social Connection and Isolation Panel    Frequency of Communication with Friends and Family: Patient declined    Frequency of Social Gatherings with Friends and Family: Patient declined    Attends Religious Services: Patient declined    Database administrator or Organizations: No    Attends Engineer, structural: Not on file    Marital Status: Patient declined       Objective:   Physical Exam Vitals reviewed.  Constitutional:      General: He is not in acute distress.    Appearance: He is well-developed.  HENT:     Head: Normocephalic.     Right Ear: Tympanic membrane normal.     Left Ear: Tympanic membrane normal.  Eyes:     General:        Right eye: No discharge.        Left eye: No discharge.     Pupils: Pupils are equal, round, and reactive to light.  Neck:     Thyroid: No thyromegaly.  Cardiovascular:     Rate and Rhythm: Normal rate and regular rhythm.     Heart sounds: Normal heart sounds. No murmur heard. Pulmonary:     Effort: Pulmonary effort is normal. No respiratory distress.     Breath sounds: Normal breath sounds. No wheezing.  Abdominal:     General: Bowel sounds are normal. There is no distension.     Palpations: Abdomen is soft.     Tenderness: There is no abdominal tenderness.  Musculoskeletal:        General: No tenderness. Normal range of motion.     Cervical back: Normal range of motion and neck supple.  Skin:    General: Skin is warm and dry.     Findings: No erythema or rash.  Neurological:     Mental Status: He is alert and oriented to person, place, and time.     Cranial Nerves: No cranial nerve deficit.     Deep Tendon Reflexes: Reflexes are normal and  symmetric.  Psychiatric:        Behavior: Behavior normal.        Thought Content: Thought content normal.        Judgment: Judgment normal.     BP 109/75   Pulse 88   Temp 98.1 F (36.7 C)   Ht 6' (1.829 m)   Wt 216 lb 12.8 oz (98.3 kg)   SpO2 97%   BMI 29.40 kg/m      Assessment & Plan:   Ricardo FORBES [name]. comes in today with chief complaint of 3 MONTH FOLLOW UP   Diagnosis and orders addressed:  1. Mixed hyperlipidemia - atorvastatin  (LIPITOR) 10 MG tablet; Take 1 tablet (10 mg total) by mouth daily.  Dispense: 90 tablet; Refill: 3 - CMP14+EGFR  2. Type 2 diabetes mellitus with hyperglycemia, without long-term current use of insulin  (HCC) (Primary) - atorvastatin  (LIPITOR) 10 MG tablet; Take 1 tablet (10 mg total) by mouth daily.  Dispense: 90 tablet; Refill: 3 - Bayer DCA Hb A1c Waived - CMP14+EGFR  3. Diabetic nephropathy associated with type 2 diabetes mellitus (HCC)  - gabapentin  (NEURONTIN ) 100 MG capsule; TAKE 1 TO 3 CAPSULES BY MOUTH ONCE DAILY AS NEEDED  Dispense: 90 capsule; Refill: 3 - CMP14+EGFR  4. Essential hypertension - CMP14+EGFR  5. Gastroesophageal reflux disease, unspecified whether esophagitis present  - CMP14+EGFR  6. Current smoker - CMP14+EGFR  7. Erectile dysfunction, unspecified erectile dysfunction type Will start Viagra   - sildenafil  (VIAGRA ) 100 MG tablet; Take 0.5-1 tablets (50-100 mg total) by mouth daily as needed for erectile dysfunction.  Dispense: 30 tablet; Refill: 2 - CMP14+EGFR   Labs pending Continue current medications  Health Maintenance reviewed Diet and exercise encouraged  Follow up plan: 6 months    Bari Learn, FNP

## 2023-10-22 ENCOUNTER — Ambulatory Visit: Payer: Self-pay | Admitting: Family

## 2023-10-22 LAB — CMP14+EGFR
ALT: 34 IU/L (ref 0–44)
AST: 18 IU/L (ref 0–40)
Albumin: 4.8 g/dL (ref 3.8–4.9)
Alkaline Phosphatase: 59 IU/L (ref 44–121)
BUN/Creatinine Ratio: 10 (ref 9–20)
BUN: 8 mg/dL (ref 6–24)
Bilirubin Total: 0.4 mg/dL (ref 0.0–1.2)
CO2: 23 mmol/L (ref 20–29)
Calcium: 10.1 mg/dL (ref 8.7–10.2)
Chloride: 96 mmol/L (ref 96–106)
Creatinine, Ser: 0.77 mg/dL (ref 0.76–1.27)
Globulin, Total: 2.6 g/dL (ref 1.5–4.5)
Glucose: 185 mg/dL — ABNORMAL HIGH (ref 70–99)
Potassium: 4.4 mmol/L (ref 3.5–5.2)
Sodium: 136 mmol/L (ref 134–144)
Total Protein: 7.4 g/dL (ref 6.0–8.5)
eGFR: 103 mL/min/1.73 (ref >59)

## 2023-10-22 MED ORDER — DAPAGLIFLOZIN PROPANEDIOL 10 MG PO TABS: 10.0000 mg | ORAL_TABLET | Freq: Every day | ORAL | 1 refills | Status: AC

## 2024-02-29 ENCOUNTER — Other Ambulatory Visit: Payer: Self-pay | Admitting: Family

## 2024-02-29 DIAGNOSIS — I10 Essential (primary) hypertension: Secondary | ICD-10-CM

## 2024-03-01 ENCOUNTER — Encounter: Payer: Self-pay | Admitting: Family

## 2024-03-01 NOTE — Telephone Encounter (Signed)
 Ricardo Luna NTBS in March for a 6 mos FU RFs sent to pharmacy

## 2024-03-01 NOTE — Telephone Encounter (Signed)
 "  Letter sent.   "

## 2024-04-20 ENCOUNTER — Ambulatory Visit: Admitting: Family
